# Patient Record
Sex: Male | Born: 1950 | Race: White | Hispanic: No | Marital: Married | Smoking: Never smoker
Health system: Southern US, Community
[De-identification: ages and names within clinical notes are randomized; demographics above are authoritative.]

## PROBLEM LIST (undated history)

## (undated) DIAGNOSIS — Z789 Other specified health status: Secondary | ICD-10-CM

## (undated) DIAGNOSIS — C4490 Unspecified malignant neoplasm of skin, unspecified: Secondary | ICD-10-CM

## (undated) DIAGNOSIS — D649 Anemia, unspecified: Secondary | ICD-10-CM

## (undated) DIAGNOSIS — J189 Pneumonia, unspecified organism: Secondary | ICD-10-CM

## (undated) HISTORY — PX: CYSTOSCOPY WITH INSERTION OF UROLIFT: SHX6678

---

## 1997-10-08 ENCOUNTER — Encounter: Admission: RE | Admit: 1997-10-08 | Discharge: 1997-10-08 | Payer: Self-pay | Admitting: Family Medicine

## 1999-07-10 ENCOUNTER — Encounter: Admission: RE | Admit: 1999-07-10 | Discharge: 1999-07-10 | Payer: Self-pay | Admitting: Sports Medicine

## 2001-12-02 ENCOUNTER — Encounter: Admission: RE | Admit: 2001-12-02 | Discharge: 2001-12-02 | Payer: Self-pay | Admitting: Family Medicine

## 2002-12-11 ENCOUNTER — Encounter: Admission: RE | Admit: 2002-12-11 | Discharge: 2002-12-11 | Payer: Self-pay | Admitting: Sports Medicine

## 2003-01-01 ENCOUNTER — Encounter: Admission: RE | Admit: 2003-01-01 | Discharge: 2003-01-01 | Payer: Self-pay | Admitting: Family Medicine

## 2003-01-19 ENCOUNTER — Encounter: Admission: RE | Admit: 2003-01-19 | Discharge: 2003-01-19 | Payer: Self-pay | Admitting: Sports Medicine

## 2003-08-25 ENCOUNTER — Ambulatory Visit (HOSPITAL_COMMUNITY): Admission: RE | Admit: 2003-08-25 | Discharge: 2003-08-25 | Payer: Self-pay | Admitting: Gastroenterology

## 2003-08-25 ENCOUNTER — Encounter (INDEPENDENT_AMBULATORY_CARE_PROVIDER_SITE_OTHER): Payer: Self-pay | Admitting: Specialist

## 2004-11-17 ENCOUNTER — Ambulatory Visit: Payer: Self-pay | Admitting: Sports Medicine

## 2005-04-24 ENCOUNTER — Ambulatory Visit: Payer: Self-pay | Admitting: Sports Medicine

## 2005-12-21 ENCOUNTER — Ambulatory Visit: Payer: Self-pay | Admitting: Sports Medicine

## 2006-04-26 ENCOUNTER — Ambulatory Visit: Payer: Self-pay | Admitting: Sports Medicine

## 2007-03-26 ENCOUNTER — Ambulatory Visit: Payer: Self-pay | Admitting: Sports Medicine

## 2007-03-26 DIAGNOSIS — M2480 Other specific joint derangements of unspecified joint, not elsewhere classified: Secondary | ICD-10-CM

## 2007-03-26 DIAGNOSIS — M7750 Other enthesopathy of unspecified foot: Secondary | ICD-10-CM | POA: Insufficient documentation

## 2007-04-04 ENCOUNTER — Telehealth (INDEPENDENT_AMBULATORY_CARE_PROVIDER_SITE_OTHER): Payer: Self-pay | Admitting: *Deleted

## 2007-04-08 ENCOUNTER — Ambulatory Visit: Payer: Self-pay | Admitting: Sports Medicine

## 2008-03-23 ENCOUNTER — Ambulatory Visit: Payer: Self-pay | Admitting: Sports Medicine

## 2008-03-23 DIAGNOSIS — R209 Unspecified disturbances of skin sensation: Secondary | ICD-10-CM | POA: Insufficient documentation

## 2008-03-23 DIAGNOSIS — M76829 Posterior tibial tendinitis, unspecified leg: Secondary | ICD-10-CM | POA: Insufficient documentation

## 2008-04-01 ENCOUNTER — Ambulatory Visit: Payer: Self-pay | Admitting: Sports Medicine

## 2008-04-01 DIAGNOSIS — M79609 Pain in unspecified limb: Secondary | ICD-10-CM | POA: Insufficient documentation

## 2008-04-23 HISTORY — PX: OTHER SURGICAL HISTORY: SHX169

## 2009-10-03 ENCOUNTER — Ambulatory Visit: Payer: Self-pay | Admitting: Family Medicine

## 2009-10-03 DIAGNOSIS — M722 Plantar fascial fibromatosis: Secondary | ICD-10-CM

## 2009-10-03 DIAGNOSIS — M25559 Pain in unspecified hip: Secondary | ICD-10-CM

## 2010-05-23 NOTE — Assessment & Plan Note (Signed)
Summary: PF,R FOOT PAIN,MC   Vital Signs:  Patient profile:   60 year old male BP sitting:   111 / 78  Vitals Entered By: Lillia Pauls CMA (October 03, 2009 3:39 PM)  History of Present Illness: 1_ right posterior hip pain--worse after 2nd 20 minute exercise period on stationary bike. No radiation, no numbness or tingling inleg. no specific injury. No bowel or bladder incontinence. Pain  4/10 at worst. Some pain w stairs.  2) Right foot pain (plantar fascuia area). has had once before about a year ago and it resolved with some stretches, Pain worst first thing in am--shrp, localized. Wears orthotics regularly and brings them as well as several pairs of shoes for eval. pain worse with extended walking which he does 3-4 m per day for exercise  Past History:  Past Medical History: Last updated: 03/26/2007 Pt. was seen in 1/08 for some shoulder subluxation.  Review of Systems  The patient denies anorexia, fever, weight loss, and weight gain.         see hpi  Physical Exam  General:  alert, well-developed, and well-nourished.     Hip Exam  Hip Exam:    Right:    Inspection:  Normal       Location:  TTP posterior to greater trochanter (insertion gluteus medius)    Swelling:  no    Erythema:  no    ABductor strength on right slightly less than left. has FROm hip. backward extension against resistance increases pain some  GAIt is normal   Foot/Ankle Exam  Foot Exam:    Right:    Inspection:  Normal       Location:  TTP plantar fascia origin    Stability:  stable    Swelling:  no    Erythema:  no    slight pes planus, loss transverse arch neurovascularly intact skin without erythema or lesion, no significant callous. DP pulse 2=   Impression & Recommendations:  Problem # 1:  HIP PAIN, RIGHT (ICD-719.45) weakgluteus medius --exercise HO given  and explained  Problem # 2:  PLANTAR FASCIITIS, RIGHT (ICD-728.71) plantar fascia exercises and stretch given in HO form  and explained in detail rtc 2-3 weeks. if no improvement at that  time could consider injection. I examined both of his orthotic sets and all 4 pairs of shoes and made some recommendatioons. i think current orthotics in OK shape--in 3 m or so will likely need replacement heel posting.

## 2010-06-05 ENCOUNTER — Ambulatory Visit (INDEPENDENT_AMBULATORY_CARE_PROVIDER_SITE_OTHER): Payer: 59 | Admitting: Sports Medicine

## 2010-06-05 ENCOUNTER — Encounter: Payer: Self-pay | Admitting: Sports Medicine

## 2010-06-05 DIAGNOSIS — M79609 Pain in unspecified limb: Secondary | ICD-10-CM

## 2010-06-13 ENCOUNTER — Ambulatory Visit: Payer: Self-pay | Admitting: Sports Medicine

## 2010-06-14 NOTE — Assessment & Plan Note (Signed)
Summary: BIG TOE LEFT FOOT/MJD   Vital Signs:  Patient profile:   60 year old male Pulse rate:   77 / minute BP sitting:   133 / 77  (right arm)  Vitals Entered By: Rochele Pages RN (June 05, 2010 11:57 AM) CC: lt great toe pain w/ walking, posterior hip pain flaring    CC:  lt great toe pain w/ walking and posterior hip pain flaring .  History of Present Illness: left big toe pain x 1 month: burning sensation/pain comes and goes, intermittent, occasional numbness tingling on left great toe lateral, planter side, changed walking shoes and soon after began to have these symptoms. notices the symptoms more after walking on track.  hx of left foot metataralsia  Preventive Screening-Counseling & Management  Alcohol-Tobacco     Smoking Status: never  Social History: Smoking Status:  never  Physical Exam  General:  BP 133/77 Well-developed,well-nourished,in no acute distress; alert,appropriate and cooperative throughout examination Msk:  foot exam: hallux rigidus bilat no joint swelling, no joint redness.   transverse arch collapse bilateral. sensation grossly intact. 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   Pulses:  2+ pedal pulses Additional Exam:  gait shows mild pronation at great toe but otherwise normal   Impression & Recommendations:  Problem # 1:  FOOT PAIN, LEFT (ICD-729.5) pt description of pain/discomfort seems nerve in nature.  May be having some compression of foot 2/2 orthotic wear and causing resultant symptoms.  Orthotic improved with new transverse arch support. Pt to continue hip strengthening and gluteus medias exercises.  pt to try new orthotic and return as needed.  note new MT padding on left lat heel wedge - soft added w foam   Orders Added: 1)  Est. Patient Level III [60454]

## 2010-09-07 ENCOUNTER — Other Ambulatory Visit: Payer: Self-pay | Admitting: Dermatology

## 2010-09-08 NOTE — Op Note (Signed)
Alexander Ortega, Alexander Ortega                         ACCOUNT NO.:  [id number]   MEDICAL RECORD NO.:  [id number]                   PATIENT TYPE:  AMB   LOCATION:  ENDO                                 FACILITY:  [facility]   PHYSICIAN:  Bernette Redbird, M.D.                DATE OF BIRTH:  1951/01/22   DATE OF PROCEDURE:  08/25/2003  DATE OF DISCHARGE:                                 OPERATIVE REPORT   PROCEDURE:  Colonoscopy with biopsies.   INDICATION:  Screening for colon cancer in an asymptomatic 60 year old male.   FINDINGS:  Two diminutive sessile polyps.   DESCRIPTION OF PROCEDURE:  The nature, purpose, and risks of the procedure  had been discussed with the patient who provided written consent.  Sedation  was fentanyl 75 mcg and Versed 7 mg IV without arrhythmias or desaturation.  Digital exam of the prostate showed it to be somewhat homogeneously enlarged  but with a smooth texture and no discrete or suspicious areas of nodularity.  The Olympus adjustable-tension pediatric video colonoscope was advanced to  the cecum with a little bit of looping overcome by external abdominal  compression.  The cecum was identified by clear visualization of the  appendiceal orifice, and pullback was then performed.  The quality of the  prep was quite good.  We used some irrigation to clean things up a little  bit, but it is felt that all areas were well seen.   In the proximal ascending colon, was a 2 x 4 mm very flat sessile polyp  which was removed by several Just above the cecum, were a couple of small,  sessile, roughly 3 mm polyps, each removed by several cold biopsies and in  the distal transverse colon near splenic flexure region, there was a 2 mm  sessile polyp removed by a single cold biopsy.  No large polyps, cancer,  colitis, vascular malformations, or diverticulosis were noted, and  retroflexion in the rectum as well as reinspection of the rectum was  unremarkable.  The patient tolerated the  procedure well, and there were no  apparent complications.   IMPRESSION:  Two diminutive colon polyps, removed as described above  (211.3).   PLAN:  Await pathology results.                                               Bernette Redbird, M.D.    RB/MEDQ  D:  08/25/2003  T:  08/25/2003  Job:  [id number]   cc:   Caryn Bee L. Little, M.D.  [address]  [place]  [place] [address]  Fax: [phone]

## 2011-04-09 ENCOUNTER — Ambulatory Visit (INDEPENDENT_AMBULATORY_CARE_PROVIDER_SITE_OTHER): Payer: 59 | Admitting: Sports Medicine

## 2011-04-09 DIAGNOSIS — M79673 Pain in unspecified foot: Secondary | ICD-10-CM

## 2011-04-09 DIAGNOSIS — M79609 Pain in unspecified limb: Secondary | ICD-10-CM

## 2011-04-12 DIAGNOSIS — M79673 Pain in unspecified foot: Secondary | ICD-10-CM | POA: Insufficient documentation

## 2011-04-12 NOTE — Assessment & Plan Note (Signed)
We added forefoot support and a 1st ray post to his existing orthotics. Unfortunately, there is no way to make him symptom free in bare feet because he will have symptoms whenever the transverse arch is not supported.  He understands this and will try to wear shoes most of the time.  He will also discontinue use of the spacer between the 1st and 2nd digits as this seems to be contributing to increased pressure points on the forefoot.

## 2011-04-12 NOTE — Progress Notes (Signed)
  Subjective:    Patient ID: Alexander Ortega, male    DOB: 11/24/1950, 60 y.o.   MRN: [id number]  HPI 60 y/o male with a history of metatarsalgia treated with orthotics and metatarsal pads is here for follow up.  His previous symptoms have resolved with the use of orthotics.  He is wondering if he needs new orthotics since he has had these for about 3 years.  Also, he has a new pain around the base of the 3rd and 4th toes which he only has in bare feet.  He also has some pain along the lateral surface of the left great toe at the end of the day.  He wears spacers between the 1st and 2nd digits as well as the 3rd and 4th digits.  No new injury.     Review of Systems     Objective:   Physical Exam  Right foot:  He has morton's toe, hind foot valgus and pronation of the midfoot with collapse of long and transverse arch. 5th toe turned in.   Left foot:  He has morton's toe, neutral to mild hindfoot varus with some midfoot pronation. Relatively preserved long arch. Transverse arch collapsed with 5th toe turned in. 3rd and forth digits rotated and rubbing against each other.   There is not tenderness to palpation or swelling of the great toe.    Bilateral lateral foot breakdown worse on left.       Assessment & Plan:

## 2011-08-30 ENCOUNTER — Other Ambulatory Visit: Payer: Self-pay | Admitting: Dermatology

## 2012-04-24 ENCOUNTER — Encounter: Payer: Self-pay | Admitting: Sports Medicine

## 2012-04-24 ENCOUNTER — Ambulatory Visit (INDEPENDENT_AMBULATORY_CARE_PROVIDER_SITE_OTHER): Payer: 59 | Admitting: Sports Medicine

## 2012-04-24 VITALS — BP 118/75 | HR 86 | Ht 70.5 in | Wt 177.0 lb

## 2012-04-24 DIAGNOSIS — M79609 Pain in unspecified limb: Secondary | ICD-10-CM

## 2012-04-24 DIAGNOSIS — M722 Plantar fascial fibromatosis: Secondary | ICD-10-CM

## 2012-04-24 NOTE — Assessment & Plan Note (Signed)
This is a chronic condition  I think it is more associated with his arch collapse and has posterior tibial tendon dysfunction  Given a series of rehabilitation exercises  He does much better with his custom orthotics and we made him a new pair today  Patient was fitted for a : standard, cushioned, semi-rigid orthotic. The orthotic was heated and afterward the patient stood on the orthotic blank positioned on the orthotic stand. The patient was positioned in subtalar neutral position and 10 degrees of ankle dorsiflexion in a weight bearing stance. After completion of molding, a stable base was applied to the orthotic blank. The blank was ground to a stable position for weight bearing. Size: 12 red EVA Base: blue EVA Posting: MT cookies Additional orthotic padding: none but may add lateral post if needed in future  40 mins time

## 2012-04-24 NOTE — Progress Notes (Signed)
  Subjective:    Patient ID: Alexander Ortega, male    DOB: Dec 25, 1950, 62 y.o.   MRN: [id number]  HPI  Pt presents to clinic for f/u of foot pain. Hx of rt heel pain, this has flared recently. Has had plantar fasciitis in the past. Pain slightly worse in the mornings.  Continues to have lt 3-4 MTP pain, worse without foot support- Dr. Lestine Box dx with synovitis.  Using orthotics that were made here about 4 yrs ago.  Also wearing toe spacers on lt between 3-4 toes and sometimes between 1st and 2nd. Rt sometimes wears spacer between 2-3 toes.  Walks 3-4 miles 4-5 days per week.     Review of Systems     Objective:   Physical Exam  2nd toe curls under 1st toe L>R Valgus shift of 1st toe R>L bilat bunionettes  Loss of trans arch bilat Loss of long arch bilat Good post tib function on lt, limited on rt 4th toe deviates under 3rd toe when seated L > R  Rt heel- slightly tender but not directly over the insertion of the plantar fascia     Assessment & Plan:

## 2012-04-24 NOTE — Assessment & Plan Note (Signed)
There is significant break down of the feet bilaterally  He should use orthotics or some support in virtually all of his shoes when he standing or walking

## 2013-11-19 ENCOUNTER — Other Ambulatory Visit: Payer: Self-pay | Admitting: Gastroenterology

## 2015-12-29 ENCOUNTER — Ambulatory Visit (INDEPENDENT_AMBULATORY_CARE_PROVIDER_SITE_OTHER): Payer: 59 | Admitting: Sports Medicine

## 2015-12-29 ENCOUNTER — Encounter: Payer: Self-pay | Admitting: Sports Medicine

## 2015-12-29 DIAGNOSIS — M79671 Pain in right foot: Secondary | ICD-10-CM | POA: Diagnosis not present

## 2015-12-29 DIAGNOSIS — M25559 Pain in unspecified hip: Secondary | ICD-10-CM | POA: Diagnosis not present

## 2015-12-29 DIAGNOSIS — M79672 Pain in left foot: Secondary | ICD-10-CM | POA: Diagnosis not present

## 2015-12-29 NOTE — Assessment & Plan Note (Signed)
His orthotics are 65, 91 and 63 years old but are actually all in pretty good shape  We added additional padding to the left orthotic to support his fifth ray  He should continue using these for walking or running activity

## 2015-12-29 NOTE — Progress Notes (Signed)
CC: Back pain and advice about running  The patient had degenerative disc compression in 2003 after traveling He had seen an orthopedist and switched from running to walking He did some exercises for his back Since that time he has not really had back pain in a few years He walks regularly 10-15 miles per week He would like to start adding a little bit of running again to his walking  His other chronic issues is bilateral foot pain He has some structural breakdown of the feet and so we have kept him in custom orthotics for 15 years In his newest pair he is getting some lateral foot pain she asked me to look at this  Past history  metatarsalgia Right hip pain over the lateral hip  Review of systems No sciatica No cough or sneeze pain in his low back No numbness or weakness in his legs  Physical examination Pleasant male in no acute distress BP 105/71   Ht 5\' 11"  (1.803 m)   Wt 175 lb (79.4 kg)   BMI 24.41 kg/m   Back flexibility is good on flexion and extension without pain Lateral rotation is good Side bending does not cause back pain SI joints have normal mobility Straight leg raise to 80 with no pain bilaterally Strength testing is good of her hamstrings and quads Weakness over his hip abductors bilaterally Feet show structural breakdown of the transverse arch Lateral column breakdown particularly on the left

## 2015-12-29 NOTE — Patient Instructions (Signed)
OK to gradually start some running Do walk run with no more than 10 mins. Total running at first  Hip weakness in gluteus medius Do 3 exercises  Good to add biking once or twice a week to work other muslces  For back do Knee to chest stretch Knee to the opposite shoulder 3 stretches for 20 secs each

## 2015-12-29 NOTE — Assessment & Plan Note (Signed)
I evaluated his low back and gave him a couple of stretches to do for this  His bigger problem is some weakness in his hip abduction He was given a home exercise program for this  Continue back stretches  Okay to start adding some running to his exercise program

## 2018-01-29 DIAGNOSIS — N401 Enlarged prostate with lower urinary tract symptoms: Secondary | ICD-10-CM | POA: Diagnosis not present

## 2018-01-29 DIAGNOSIS — R351 Nocturia: Secondary | ICD-10-CM | POA: Diagnosis not present

## 2018-01-29 DIAGNOSIS — R972 Elevated prostate specific antigen [PSA]: Secondary | ICD-10-CM | POA: Diagnosis not present

## 2018-02-03 DIAGNOSIS — R351 Nocturia: Secondary | ICD-10-CM | POA: Diagnosis not present

## 2018-02-20 DIAGNOSIS — C44722 Squamous cell carcinoma of skin of right lower limb, including hip: Secondary | ICD-10-CM | POA: Diagnosis not present

## 2018-02-20 DIAGNOSIS — Z85828 Personal history of other malignant neoplasm of skin: Secondary | ICD-10-CM | POA: Diagnosis not present

## 2018-02-20 DIAGNOSIS — D485 Neoplasm of uncertain behavior of skin: Secondary | ICD-10-CM | POA: Diagnosis not present

## 2018-02-20 DIAGNOSIS — Z85820 Personal history of malignant melanoma of skin: Secondary | ICD-10-CM | POA: Diagnosis not present

## 2018-02-20 DIAGNOSIS — L821 Other seborrheic keratosis: Secondary | ICD-10-CM | POA: Diagnosis not present

## 2018-03-06 DIAGNOSIS — C44722 Squamous cell carcinoma of skin of right lower limb, including hip: Secondary | ICD-10-CM | POA: Diagnosis not present

## 2018-03-06 DIAGNOSIS — Z85820 Personal history of malignant melanoma of skin: Secondary | ICD-10-CM | POA: Diagnosis not present

## 2018-03-06 DIAGNOSIS — Z85828 Personal history of other malignant neoplasm of skin: Secondary | ICD-10-CM | POA: Diagnosis not present

## 2018-03-24 DIAGNOSIS — M47892 Other spondylosis, cervical region: Secondary | ICD-10-CM | POA: Diagnosis not present

## 2018-03-24 DIAGNOSIS — M542 Cervicalgia: Secondary | ICD-10-CM | POA: Diagnosis not present

## 2018-04-02 DIAGNOSIS — Z86010 Personal history of colonic polyps: Secondary | ICD-10-CM | POA: Diagnosis not present

## 2018-04-02 DIAGNOSIS — Z23 Encounter for immunization: Secondary | ICD-10-CM | POA: Diagnosis not present

## 2018-04-02 DIAGNOSIS — E7800 Pure hypercholesterolemia, unspecified: Secondary | ICD-10-CM | POA: Diagnosis not present

## 2018-04-02 DIAGNOSIS — Z79899 Other long term (current) drug therapy: Secondary | ICD-10-CM | POA: Diagnosis not present

## 2018-04-02 DIAGNOSIS — N401 Enlarged prostate with lower urinary tract symptoms: Secondary | ICD-10-CM | POA: Diagnosis not present

## 2018-04-02 DIAGNOSIS — Z85820 Personal history of malignant melanoma of skin: Secondary | ICD-10-CM | POA: Diagnosis not present

## 2018-04-02 DIAGNOSIS — M791 Myalgia, unspecified site: Secondary | ICD-10-CM | POA: Diagnosis not present

## 2018-04-02 DIAGNOSIS — Z0000 Encounter for general adult medical examination without abnormal findings: Secondary | ICD-10-CM | POA: Diagnosis not present

## 2018-04-02 DIAGNOSIS — M47816 Spondylosis without myelopathy or radiculopathy, lumbar region: Secondary | ICD-10-CM | POA: Diagnosis not present

## 2018-05-15 DIAGNOSIS — Z85820 Personal history of malignant melanoma of skin: Secondary | ICD-10-CM | POA: Diagnosis not present

## 2018-05-15 DIAGNOSIS — Z85828 Personal history of other malignant neoplasm of skin: Secondary | ICD-10-CM | POA: Diagnosis not present

## 2018-05-15 DIAGNOSIS — L720 Epidermal cyst: Secondary | ICD-10-CM | POA: Diagnosis not present

## 2018-07-02 DIAGNOSIS — N401 Enlarged prostate with lower urinary tract symptoms: Secondary | ICD-10-CM | POA: Diagnosis not present

## 2018-07-02 DIAGNOSIS — R972 Elevated prostate specific antigen [PSA]: Secondary | ICD-10-CM | POA: Diagnosis not present

## 2018-07-02 DIAGNOSIS — R351 Nocturia: Secondary | ICD-10-CM | POA: Diagnosis not present

## 2018-07-03 DIAGNOSIS — D2272 Melanocytic nevi of left lower limb, including hip: Secondary | ICD-10-CM | POA: Diagnosis not present

## 2018-07-03 DIAGNOSIS — D2271 Melanocytic nevi of right lower limb, including hip: Secondary | ICD-10-CM | POA: Diagnosis not present

## 2018-07-03 DIAGNOSIS — Z85820 Personal history of malignant melanoma of skin: Secondary | ICD-10-CM | POA: Diagnosis not present

## 2018-07-03 DIAGNOSIS — Z85828 Personal history of other malignant neoplasm of skin: Secondary | ICD-10-CM | POA: Diagnosis not present

## 2018-07-03 DIAGNOSIS — D1801 Hemangioma of skin and subcutaneous tissue: Secondary | ICD-10-CM | POA: Diagnosis not present

## 2018-07-03 DIAGNOSIS — L821 Other seborrheic keratosis: Secondary | ICD-10-CM | POA: Diagnosis not present

## 2018-07-03 DIAGNOSIS — L570 Actinic keratosis: Secondary | ICD-10-CM | POA: Diagnosis not present

## 2018-07-03 DIAGNOSIS — L723 Sebaceous cyst: Secondary | ICD-10-CM | POA: Diagnosis not present

## 2018-07-03 DIAGNOSIS — D2262 Melanocytic nevi of left upper limb, including shoulder: Secondary | ICD-10-CM | POA: Diagnosis not present

## 2018-07-03 DIAGNOSIS — L738 Other specified follicular disorders: Secondary | ICD-10-CM | POA: Diagnosis not present

## 2018-07-03 DIAGNOSIS — L814 Other melanin hyperpigmentation: Secondary | ICD-10-CM | POA: Diagnosis not present

## 2018-07-03 DIAGNOSIS — D225 Melanocytic nevi of trunk: Secondary | ICD-10-CM | POA: Diagnosis not present

## 2018-09-01 DIAGNOSIS — C44729 Squamous cell carcinoma of skin of left lower limb, including hip: Secondary | ICD-10-CM | POA: Diagnosis not present

## 2018-09-01 DIAGNOSIS — L239 Allergic contact dermatitis, unspecified cause: Secondary | ICD-10-CM | POA: Diagnosis not present

## 2018-09-01 DIAGNOSIS — Z85828 Personal history of other malignant neoplasm of skin: Secondary | ICD-10-CM | POA: Diagnosis not present

## 2018-09-01 DIAGNOSIS — D485 Neoplasm of uncertain behavior of skin: Secondary | ICD-10-CM | POA: Diagnosis not present

## 2018-09-01 DIAGNOSIS — Z85820 Personal history of malignant melanoma of skin: Secondary | ICD-10-CM | POA: Diagnosis not present

## 2018-09-04 DIAGNOSIS — L0889 Other specified local infections of the skin and subcutaneous tissue: Secondary | ICD-10-CM | POA: Diagnosis not present

## 2018-11-26 DIAGNOSIS — Z85820 Personal history of malignant melanoma of skin: Secondary | ICD-10-CM | POA: Diagnosis not present

## 2018-11-26 DIAGNOSIS — D485 Neoplasm of uncertain behavior of skin: Secondary | ICD-10-CM | POA: Diagnosis not present

## 2018-11-26 DIAGNOSIS — C44729 Squamous cell carcinoma of skin of left lower limb, including hip: Secondary | ICD-10-CM | POA: Diagnosis not present

## 2018-11-26 DIAGNOSIS — Z85828 Personal history of other malignant neoplasm of skin: Secondary | ICD-10-CM | POA: Diagnosis not present

## 2019-01-14 DIAGNOSIS — R972 Elevated prostate specific antigen [PSA]: Secondary | ICD-10-CM | POA: Diagnosis not present

## 2019-01-15 DIAGNOSIS — I8392 Asymptomatic varicose veins of left lower extremity: Secondary | ICD-10-CM | POA: Diagnosis not present

## 2019-01-15 DIAGNOSIS — I8391 Asymptomatic varicose veins of right lower extremity: Secondary | ICD-10-CM | POA: Diagnosis not present

## 2019-01-15 DIAGNOSIS — L821 Other seborrheic keratosis: Secondary | ICD-10-CM | POA: Diagnosis not present

## 2019-01-15 DIAGNOSIS — I8312 Varicose veins of left lower extremity with inflammation: Secondary | ICD-10-CM | POA: Diagnosis not present

## 2019-01-15 DIAGNOSIS — L819 Disorder of pigmentation, unspecified: Secondary | ICD-10-CM | POA: Diagnosis not present

## 2019-01-15 DIAGNOSIS — Z85828 Personal history of other malignant neoplasm of skin: Secondary | ICD-10-CM | POA: Diagnosis not present

## 2019-01-15 DIAGNOSIS — D2262 Melanocytic nevi of left upper limb, including shoulder: Secondary | ICD-10-CM | POA: Diagnosis not present

## 2019-01-15 DIAGNOSIS — Z85820 Personal history of malignant melanoma of skin: Secondary | ICD-10-CM | POA: Diagnosis not present

## 2019-01-15 DIAGNOSIS — I8311 Varicose veins of right lower extremity with inflammation: Secondary | ICD-10-CM | POA: Diagnosis not present

## 2019-01-15 DIAGNOSIS — L720 Epidermal cyst: Secondary | ICD-10-CM | POA: Diagnosis not present

## 2019-01-15 DIAGNOSIS — L814 Other melanin hyperpigmentation: Secondary | ICD-10-CM | POA: Diagnosis not present

## 2019-01-15 DIAGNOSIS — I872 Venous insufficiency (chronic) (peripheral): Secondary | ICD-10-CM | POA: Diagnosis not present

## 2019-02-27 DIAGNOSIS — N5201 Erectile dysfunction due to arterial insufficiency: Secondary | ICD-10-CM | POA: Diagnosis not present

## 2019-02-27 DIAGNOSIS — R351 Nocturia: Secondary | ICD-10-CM | POA: Diagnosis not present

## 2019-02-27 DIAGNOSIS — N401 Enlarged prostate with lower urinary tract symptoms: Secondary | ICD-10-CM | POA: Diagnosis not present

## 2019-02-27 DIAGNOSIS — R972 Elevated prostate specific antigen [PSA]: Secondary | ICD-10-CM | POA: Diagnosis not present

## 2019-03-12 DIAGNOSIS — L820 Inflamed seborrheic keratosis: Secondary | ICD-10-CM | POA: Diagnosis not present

## 2019-03-12 DIAGNOSIS — Z85820 Personal history of malignant melanoma of skin: Secondary | ICD-10-CM | POA: Diagnosis not present

## 2019-03-12 DIAGNOSIS — Z85828 Personal history of other malignant neoplasm of skin: Secondary | ICD-10-CM | POA: Diagnosis not present

## 2019-03-12 DIAGNOSIS — D2262 Melanocytic nevi of left upper limb, including shoulder: Secondary | ICD-10-CM | POA: Diagnosis not present

## 2019-03-17 ENCOUNTER — Other Ambulatory Visit: Payer: Self-pay | Admitting: Urology

## 2019-03-17 DIAGNOSIS — R972 Elevated prostate specific antigen [PSA]: Secondary | ICD-10-CM

## 2019-04-08 DIAGNOSIS — Z1159 Encounter for screening for other viral diseases: Secondary | ICD-10-CM | POA: Diagnosis not present

## 2019-04-08 DIAGNOSIS — Z79899 Other long term (current) drug therapy: Secondary | ICD-10-CM | POA: Diagnosis not present

## 2019-04-08 DIAGNOSIS — Z0000 Encounter for general adult medical examination without abnormal findings: Secondary | ICD-10-CM | POA: Diagnosis not present

## 2019-04-08 DIAGNOSIS — N401 Enlarged prostate with lower urinary tract symptoms: Secondary | ICD-10-CM | POA: Diagnosis not present

## 2019-04-08 DIAGNOSIS — Z85820 Personal history of malignant melanoma of skin: Secondary | ICD-10-CM | POA: Diagnosis not present

## 2019-04-08 DIAGNOSIS — H6123 Impacted cerumen, bilateral: Secondary | ICD-10-CM | POA: Diagnosis not present

## 2019-04-08 DIAGNOSIS — R972 Elevated prostate specific antigen [PSA]: Secondary | ICD-10-CM | POA: Diagnosis not present

## 2019-04-08 DIAGNOSIS — E7800 Pure hypercholesterolemia, unspecified: Secondary | ICD-10-CM | POA: Diagnosis not present

## 2019-04-08 DIAGNOSIS — Z23 Encounter for immunization: Secondary | ICD-10-CM | POA: Diagnosis not present

## 2019-04-09 ENCOUNTER — Ambulatory Visit (HOSPITAL_COMMUNITY): Admission: RE | Admit: 2019-04-09 | Payer: PPO | Source: Ambulatory Visit

## 2019-04-28 ENCOUNTER — Ambulatory Visit (HOSPITAL_COMMUNITY): Payer: PPO

## 2019-05-05 ENCOUNTER — Other Ambulatory Visit: Payer: Self-pay

## 2019-05-05 ENCOUNTER — Ambulatory Visit (HOSPITAL_COMMUNITY)
Admission: RE | Admit: 2019-05-05 | Discharge: 2019-05-05 | Disposition: A | Discharge status: Home / Self Care | Payer: PPO | Source: Ambulatory Visit | Attending: Urology | Admitting: Urology

## 2019-05-05 DIAGNOSIS — R972 Elevated prostate specific antigen [PSA]: Secondary | ICD-10-CM | POA: Insufficient documentation

## 2019-05-05 LAB — POCT I-STAT CREATININE: Creatinine, Ser: 1 mg/dL (ref 0.61–1.24)

## 2019-05-05 MED ORDER — GADOBUTROL 1 MMOL/ML IV SOLN
8.0000 mL | Freq: Once | INTRAVENOUS | Status: AC | PRN
  Administered 2019-05-05: 8 mL via INTRAVENOUS

## 2019-05-05 MED ORDER — LIDOCAINE HCL URETHRAL/MUCOSAL 2 % EX GEL
CUTANEOUS | Status: AC
  Filled 2019-05-05: qty 30

## 2019-05-20 DIAGNOSIS — Z87898 Personal history of other specified conditions: Secondary | ICD-10-CM | POA: Diagnosis not present

## 2019-05-20 DIAGNOSIS — M25511 Pain in right shoulder: Secondary | ICD-10-CM | POA: Diagnosis not present

## 2019-06-29 DIAGNOSIS — M25511 Pain in right shoulder: Secondary | ICD-10-CM | POA: Diagnosis not present

## 2019-07-15 DIAGNOSIS — D2271 Melanocytic nevi of right lower limb, including hip: Secondary | ICD-10-CM | POA: Diagnosis not present

## 2019-07-15 DIAGNOSIS — L738 Other specified follicular disorders: Secondary | ICD-10-CM | POA: Diagnosis not present

## 2019-07-15 DIAGNOSIS — Z85828 Personal history of other malignant neoplasm of skin: Secondary | ICD-10-CM | POA: Diagnosis not present

## 2019-07-15 DIAGNOSIS — D1801 Hemangioma of skin and subcutaneous tissue: Secondary | ICD-10-CM | POA: Diagnosis not present

## 2019-07-15 DIAGNOSIS — I788 Other diseases of capillaries: Secondary | ICD-10-CM | POA: Diagnosis not present

## 2019-07-15 DIAGNOSIS — L812 Freckles: Secondary | ICD-10-CM | POA: Diagnosis not present

## 2019-07-15 DIAGNOSIS — D225 Melanocytic nevi of trunk: Secondary | ICD-10-CM | POA: Diagnosis not present

## 2019-07-15 DIAGNOSIS — D2272 Melanocytic nevi of left lower limb, including hip: Secondary | ICD-10-CM | POA: Diagnosis not present

## 2019-07-15 DIAGNOSIS — L820 Inflamed seborrheic keratosis: Secondary | ICD-10-CM | POA: Diagnosis not present

## 2019-07-15 DIAGNOSIS — L814 Other melanin hyperpigmentation: Secondary | ICD-10-CM | POA: Diagnosis not present

## 2019-07-15 DIAGNOSIS — Z85820 Personal history of malignant melanoma of skin: Secondary | ICD-10-CM | POA: Diagnosis not present

## 2019-07-15 DIAGNOSIS — L821 Other seborrheic keratosis: Secondary | ICD-10-CM | POA: Diagnosis not present

## 2019-08-10 DIAGNOSIS — M25511 Pain in right shoulder: Secondary | ICD-10-CM | POA: Diagnosis not present

## 2019-08-19 DIAGNOSIS — N401 Enlarged prostate with lower urinary tract symptoms: Secondary | ICD-10-CM | POA: Diagnosis not present

## 2019-08-24 DIAGNOSIS — M25511 Pain in right shoulder: Secondary | ICD-10-CM | POA: Diagnosis not present

## 2019-08-24 DIAGNOSIS — Z87898 Personal history of other specified conditions: Secondary | ICD-10-CM | POA: Diagnosis not present

## 2019-08-24 DIAGNOSIS — E7800 Pure hypercholesterolemia, unspecified: Secondary | ICD-10-CM | POA: Diagnosis not present

## 2019-08-26 DIAGNOSIS — N401 Enlarged prostate with lower urinary tract symptoms: Secondary | ICD-10-CM | POA: Diagnosis not present

## 2019-08-26 DIAGNOSIS — R8281 Pyuria: Secondary | ICD-10-CM | POA: Diagnosis not present

## 2019-08-26 DIAGNOSIS — R8271 Bacteriuria: Secondary | ICD-10-CM | POA: Diagnosis not present

## 2019-08-26 DIAGNOSIS — R351 Nocturia: Secondary | ICD-10-CM | POA: Diagnosis not present

## 2019-08-26 DIAGNOSIS — R972 Elevated prostate specific antigen [PSA]: Secondary | ICD-10-CM | POA: Diagnosis not present

## 2019-09-23 DIAGNOSIS — M25562 Pain in left knee: Secondary | ICD-10-CM | POA: Diagnosis not present

## 2019-10-05 DIAGNOSIS — M25562 Pain in left knee: Secondary | ICD-10-CM | POA: Diagnosis not present

## 2019-10-10 DIAGNOSIS — M25562 Pain in left knee: Secondary | ICD-10-CM | POA: Diagnosis not present

## 2019-10-12 DIAGNOSIS — M25562 Pain in left knee: Secondary | ICD-10-CM | POA: Diagnosis not present

## 2019-11-19 DIAGNOSIS — R509 Fever, unspecified: Secondary | ICD-10-CM | POA: Diagnosis not present

## 2019-11-19 DIAGNOSIS — U071 COVID-19: Secondary | ICD-10-CM | POA: Diagnosis not present

## 2019-11-20 DIAGNOSIS — U071 COVID-19: Secondary | ICD-10-CM | POA: Diagnosis not present

## 2019-11-20 DIAGNOSIS — R509 Fever, unspecified: Secondary | ICD-10-CM | POA: Diagnosis not present

## 2019-11-29 ENCOUNTER — Other Ambulatory Visit: Payer: Self-pay

## 2019-11-29 ENCOUNTER — Emergency Department (HOSPITAL_COMMUNITY): Payer: PPO

## 2019-11-29 ENCOUNTER — Inpatient Hospital Stay (HOSPITAL_COMMUNITY)
Admission: EM | Admit: 2019-11-29 | Discharge: 2019-12-01 | DRG: 177 | Disposition: A | Discharge status: Home / Self Care | Payer: PPO | Attending: Internal Medicine | Admitting: Internal Medicine

## 2019-11-29 DIAGNOSIS — U071 COVID-19: Secondary | ICD-10-CM | POA: Diagnosis not present

## 2019-11-29 DIAGNOSIS — R110 Nausea: Secondary | ICD-10-CM | POA: Diagnosis not present

## 2019-11-29 DIAGNOSIS — Z8249 Family history of ischemic heart disease and other diseases of the circulatory system: Secondary | ICD-10-CM

## 2019-11-29 DIAGNOSIS — J1282 Pneumonia due to coronavirus disease 2019: Secondary | ICD-10-CM | POA: Diagnosis not present

## 2019-11-29 DIAGNOSIS — E861 Hypovolemia: Secondary | ICD-10-CM | POA: Diagnosis not present

## 2019-11-29 DIAGNOSIS — Z7982 Long term (current) use of aspirin: Secondary | ICD-10-CM

## 2019-11-29 DIAGNOSIS — G4700 Insomnia, unspecified: Secondary | ICD-10-CM | POA: Diagnosis not present

## 2019-11-29 DIAGNOSIS — E871 Hypo-osmolality and hyponatremia: Secondary | ICD-10-CM | POA: Diagnosis not present

## 2019-11-29 DIAGNOSIS — R0902 Hypoxemia: Secondary | ICD-10-CM

## 2019-11-29 DIAGNOSIS — R918 Other nonspecific abnormal finding of lung field: Secondary | ICD-10-CM | POA: Diagnosis not present

## 2019-11-29 DIAGNOSIS — D649 Anemia, unspecified: Secondary | ICD-10-CM | POA: Diagnosis not present

## 2019-11-29 DIAGNOSIS — R21 Rash and other nonspecific skin eruption: Secondary | ICD-10-CM | POA: Diagnosis not present

## 2019-11-29 DIAGNOSIS — J9601 Acute respiratory failure with hypoxia: Secondary | ICD-10-CM | POA: Diagnosis present

## 2019-11-29 DIAGNOSIS — R0602 Shortness of breath: Secondary | ICD-10-CM | POA: Diagnosis present

## 2019-11-29 DIAGNOSIS — Z79899 Other long term (current) drug therapy: Secondary | ICD-10-CM

## 2019-11-29 DIAGNOSIS — G9340 Encephalopathy, unspecified: Secondary | ICD-10-CM

## 2019-11-29 HISTORY — DX: Other specified health status: Z78.9

## 2019-11-29 LAB — COMPREHENSIVE METABOLIC PANEL
ALT: 32 U/L (ref 0–44)
AST: 66 U/L — ABNORMAL HIGH (ref 15–41)
Albumin: 2.9 g/dL — ABNORMAL LOW (ref 3.5–5.0)
Alkaline Phosphatase: 33 U/L — ABNORMAL LOW (ref 38–126)
Anion gap: 10 (ref 5–15)
BUN: 12 mg/dL (ref 8–23)
CO2: 22 mmol/L (ref 22–32)
Calcium: 7.4 mg/dL — ABNORMAL LOW (ref 8.9–10.3)
Chloride: 92 mmol/L — ABNORMAL LOW (ref 98–111)
Creatinine, Ser: 0.91 mg/dL (ref 0.61–1.24)
GFR calc Af Amer: >60 mL/min (ref >60)
GFR calc non Af Amer: >60 mL/min (ref >60)
Glucose, Bld: 107 mg/dL — ABNORMAL HIGH (ref 70–99)
Potassium: 4 mmol/L (ref 3.5–5.1)
Sodium: 124 mmol/L — ABNORMAL LOW (ref 135–145)
Total Bilirubin: 0.5 mg/dL (ref 0.3–1.2)
Total Protein: 6.4 g/dL — ABNORMAL LOW (ref 6.5–8.1)

## 2019-11-29 LAB — CBC WITH DIFFERENTIAL/PLATELET
Abs Immature Granulocytes: 0.05 10*3/uL (ref 0.00–0.07)
Basophils Absolute: 0 10*3/uL (ref 0.0–0.1)
Basophils Relative: 0 %
Eosinophils Absolute: 0 10*3/uL (ref 0.0–0.5)
Eosinophils Relative: 1 %
HCT: 33 % — ABNORMAL LOW (ref 39.0–52.0)
Hemoglobin: 11 g/dL — ABNORMAL LOW (ref 13.0–17.0)
Immature Granulocytes: 1 %
Lymphocytes Relative: 10 %
Lymphs Abs: 0.6 10*3/uL — ABNORMAL LOW (ref 0.7–4.0)
MCH: 30.8 pg (ref 26.0–34.0)
MCHC: 33.3 g/dL (ref 30.0–36.0)
MCV: 92.4 fL (ref 80.0–100.0)
Monocytes Absolute: 0.1 10*3/uL (ref 0.1–1.0)
Monocytes Relative: 2 %
Neutro Abs: 4.6 10*3/uL (ref 1.7–7.7)
Neutrophils Relative %: 86 %
Platelets: 194 10*3/uL (ref 150–400)
RBC: 3.57 MIL/uL — ABNORMAL LOW (ref 4.22–5.81)
RDW: 13.1 % (ref 11.5–15.5)
WBC: 5.4 10*3/uL (ref 4.0–10.5)
nRBC: 0 % (ref 0.0–0.2)

## 2019-11-29 LAB — ABO/RH: ABO/RH(D): B POS

## 2019-11-29 LAB — C-REACTIVE PROTEIN: CRP: 10.8 mg/dL — ABNORMAL HIGH (ref <1.0)

## 2019-11-29 LAB — OSMOLALITY, URINE: Osmolality, Ur: 153 mOsm/kg — ABNORMAL LOW (ref 300–900)

## 2019-11-29 LAB — SARS CORONAVIRUS 2 BY RT PCR (HOSPITAL ORDER, PERFORMED IN ~~LOC~~ HOSPITAL LAB): SARS Coronavirus 2: POSITIVE — AB

## 2019-11-29 LAB — FERRITIN: Ferritin: 341 ng/mL — ABNORMAL HIGH (ref 24–336)

## 2019-11-29 LAB — LACTATE DEHYDROGENASE: LDH: 403 U/L — ABNORMAL HIGH (ref 98–192)

## 2019-11-29 LAB — D-DIMER, QUANTITATIVE: D-Dimer, Quant: 2.67 ug/mL-FEU — ABNORMAL HIGH (ref 0.00–0.50)

## 2019-11-29 LAB — FIBRINOGEN: Fibrinogen: 760 mg/dL — ABNORMAL HIGH (ref 210–475)

## 2019-11-29 LAB — SODIUM, URINE, RANDOM: Sodium, Ur: 17 mmol/L

## 2019-11-29 LAB — PROCALCITONIN: Procalcitonin: <0.1 ng/mL

## 2019-11-29 LAB — HIV ANTIBODY (ROUTINE TESTING W REFLEX): HIV Screen 4th Generation wRfx: NONREACTIVE

## 2019-11-29 MED ORDER — SODIUM CHLORIDE 0.9 % IV SOLN
100.0000 mg | Freq: Every day | INTRAVENOUS | Status: DC
  Administered 2019-11-30 – 2019-12-01 (×2): 100 mg via INTRAVENOUS
  Filled 2019-11-29 (×2): qty 20

## 2019-11-29 MED ORDER — ONDANSETRON HCL 4 MG/2ML IJ SOLN: 4.0000 mg | Freq: Four times a day (QID) | INTRAMUSCULAR | Status: DC | PRN

## 2019-11-29 MED ORDER — SODIUM CHLORIDE 0.9 % IV SOLN
100.0000 mg | Freq: Once | INTRAVENOUS | Status: AC
  Administered 2019-11-29: 100 mg via INTRAVENOUS
  Filled 2019-11-29: qty 20

## 2019-11-29 MED ORDER — ONDANSETRON HCL 4 MG PO TABS: 4.0000 mg | ORAL_TABLET | Freq: Four times a day (QID) | ORAL | Status: DC | PRN

## 2019-11-29 MED ORDER — GUAIFENESIN-DM 100-10 MG/5ML PO SYRP: 10.0000 mL | ORAL_SOLUTION | ORAL | Status: DC | PRN

## 2019-11-29 MED ORDER — SODIUM CHLORIDE 0.9 % IV SOLN: 200.0000 mg | Freq: Once | INTRAVENOUS | Status: DC

## 2019-11-29 MED ORDER — AMITRIPTYLINE HCL 25 MG PO TABS
25.0000 mg | ORAL_TABLET | Freq: Every day | ORAL | Status: DC
  Administered 2019-11-29 – 2019-11-30 (×2): 25 mg via ORAL
  Filled 2019-11-29 (×2): qty 1

## 2019-11-29 MED ORDER — SODIUM CHLORIDE 0.9 % IV SOLN: INTRAVENOUS | Status: DC

## 2019-11-29 MED ORDER — ZINC SULFATE 220 (50 ZN) MG PO CAPS
220.0000 mg | ORAL_CAPSULE | Freq: Every day | ORAL | Status: DC
  Administered 2019-11-29 – 2019-12-01 (×3): 220 mg via ORAL
  Filled 2019-11-29 (×3): qty 1

## 2019-11-29 MED ORDER — ACETAMINOPHEN 325 MG PO TABS
650.0000 mg | ORAL_TABLET | Freq: Four times a day (QID) | ORAL | Status: DC | PRN
  Administered 2019-11-29: 650 mg via ORAL
  Filled 2019-11-29: qty 2

## 2019-11-29 MED ORDER — ACETAMINOPHEN 325 MG PO TABS
650.0000 mg | ORAL_TABLET | Freq: Once | ORAL | Status: AC
  Administered 2019-11-29: 650 mg via ORAL
  Filled 2019-11-29: qty 2

## 2019-11-29 MED ORDER — TRIAMCINOLONE ACETONIDE 0.5 % EX CREA
TOPICAL_CREAM | Freq: Three times a day (TID) | CUTANEOUS | Status: DC
  Filled 2019-11-29: qty 15

## 2019-11-29 MED ORDER — HYDROCOD POLST-CPM POLST ER 10-8 MG/5ML PO SUER: 5.0000 mL | Freq: Two times a day (BID) | ORAL | Status: DC | PRN

## 2019-11-29 MED ORDER — ASCORBIC ACID 500 MG PO TABS
500.0000 mg | ORAL_TABLET | Freq: Every day | ORAL | Status: DC
  Administered 2019-11-29 – 2019-12-01 (×3): 500 mg via ORAL
  Filled 2019-11-29 (×3): qty 1

## 2019-11-29 MED ORDER — TRAZODONE HCL 100 MG PO TABS
50.0000 mg | ORAL_TABLET | Freq: Once | ORAL | Status: AC
  Administered 2019-11-30: 50 mg via ORAL
  Filled 2019-11-29: qty 1

## 2019-11-29 MED ORDER — FAMOTIDINE 20 MG PO TABS
20.0000 mg | ORAL_TABLET | Freq: Every day | ORAL | Status: DC
  Administered 2019-11-29 – 2019-12-01 (×3): 20 mg via ORAL
  Filled 2019-11-29 (×3): qty 1

## 2019-11-29 MED ORDER — ENOXAPARIN SODIUM 40 MG/0.4ML ~~LOC~~ SOLN
40.0000 mg | SUBCUTANEOUS | Status: DC
  Administered 2019-11-29 – 2019-12-01 (×3): 40 mg via SUBCUTANEOUS
  Filled 2019-11-29 (×3): qty 0.4

## 2019-11-29 MED ORDER — SODIUM CHLORIDE 0.9 % IV SOLN: 100.0000 mg | Freq: Every day | INTRAVENOUS | Status: DC

## 2019-11-29 MED ORDER — METHYLPREDNISOLONE SODIUM SUCC 125 MG IJ SOLR
60.0000 mg | Freq: Two times a day (BID) | INTRAMUSCULAR | Status: DC
  Administered 2019-11-29 – 2019-12-01 (×5): 60 mg via INTRAVENOUS
  Filled 2019-11-29 (×5): qty 2

## 2019-11-29 NOTE — ED Triage Notes (Signed)
Pt c/o COVID 19 sx starting 7/27.  Dx 7/30.  Pt c/o persistent fever, insomnia. Also c/o rash on back x1 week.  Poor PO intake. Denies dizziness, denies LOC.   Pt reports taking tylenol for fever, last taken "early this morning, 0330 or so."

## 2019-11-29 NOTE — ED Provider Notes (Signed)
[facility] DEPT Provider Note   CSN: [id number] Arrival date & time: 11/29/19  6010     History Chief Complaint  Patient presents with  . Insomnia  . Fever    Alexander Ortega is a 68 y.o. male.  HPI He presents for evaluation of difficulty sleeping, and Covid infection.  He feels like he is having a fever that will not go away despite taking Tylenol, and that that is keeping him awake at night.  He was diagnosed with a Covid infection on 12/21/2019.  He does not currently have documentation with him about the type of testing.  He complains of ongoing cough and rash associated with this illness.  He began to be sick on 11/18/2019.  His wife is also being seen today in the emergency department with Covid infection which is symptomatic.  The patient did not have a Covid vaccine.  His cough is nonproductive.  He denies shortness of breath.  He is using melatonin to help with sleep but states he is unable to sleep.  He does not have any other current complaints.  There are no other known modifying factors.    No past medical history on file.  Patient Active Problem List   Diagnosis Date Noted  . Pneumonia due to COVID-19 virus 11/29/2019  . Foot pain 04/12/2011  . HIP PAIN, RIGHT 10/03/2009  . PLANTAR FASCIITIS, RIGHT 10/03/2009  . FOOT PAIN, BILATERAL 04/01/2008  . TIBIALIS TENDINITIS 03/23/2008  . NUMBNESS 03/23/2008  . SUBLUXATION, JOINT 03/26/2007  . METATARSALGIA 03/26/2007         No family history on file.  Social History   Tobacco Use  . Smoking status: Never Smoker  . Smokeless tobacco: Never Used  Substance Use Topics  . Alcohol use: Not on file  . Drug use: Not on file    Home Medications Prior to Admission medications   Medication Sig Start Date End Date Taking? Authorizing Provider  amitriptyline (ELAVIL) 25 MG tablet Take 25 mg by mouth at bedtime.      [provider]  aspirin EC 81 MG tablet Take 81 mg by mouth  daily.    [provider]  B Complex-C-Folic Acid (MULTIVITAMIN, STRESS FORMULA) tablet Take 1 tablet by mouth daily.      [provider]  KOREAN GINSENG 100 MG CAPS Take by mouth 3 (three) times daily.      [provider]  Melatonin 1 MG TABS Take 1 mg by mouth at bedtime as needed (sleep).     [provider]  Misc Natural Products (LUTEIN 20 PO) Take by mouth 2 (two) times daily.    [provider]  Omega-3 Fatty Acids (FISH OIL) 1000 MG CPDR Take 1,000 mg by mouth daily.     [provider]  Red Yeast Rice Extract (RED YEAST RICE PO) Take 1 tablet by mouth 3 (three) times daily.     [provider]    Allergies    Patient has no known allergies.  Review of Systems   Review of Systems  All other systems reviewed and are negative.   Physical Exam Updated Vital Signs BP 126/75   Pulse 86   Temp 99.2 F (37.3 C) (Oral)   Resp (!) 23   Ht _0  (1.778 m)   Wt 78 kg   SpO2 94%   BMI 24.68 kg/m   Physical Exam Vitals and nursing note reviewed.  Constitutional:  General: He is not in acute distress.    Appearance: He is well-developed. He is not ill-appearing, toxic-appearing or diaphoretic.  HENT:     Head: Normocephalic and atraumatic.     Right Ear: External ear normal.     Left Ear: External ear normal.     Nose: No congestion or rhinorrhea.     Mouth/Throat:     Mouth: Mucous membranes are moist.     Pharynx: No oropharyngeal exudate.  Eyes:     Conjunctiva/sclera: Conjunctivae normal.     Pupils: Pupils are equal, round, and reactive to light.  Neck:     Trachea: Phonation normal.  Cardiovascular:     Rate and Rhythm: Normal rate and regular rhythm.  Pulmonary:     Effort: Pulmonary effort is normal. No respiratory distress.     Breath sounds: No stridor.  Abdominal:     General: There is no distension.     Palpations: Abdomen is soft.     Tenderness: There is no abdominal tenderness.    Musculoskeletal:        General: Normal range of motion.     Cervical back: Normal range of motion and neck supple.  Skin:    General: Skin is warm and dry.     Comments: Generalized rash of torso characterized by 2 to 3 mm red papules, some of which are excoriated.  There are no associated vesicles, petechiae or drainage.  The rash is nontender to palpation.  Neurological:     Mental Status: He is alert and oriented to person, place, and time.     Cranial Nerves: No cranial nerve deficit.     Sensory: No sensory deficit.     Motor: No abnormal muscle tone.     Coordination: Coordination normal.  Psychiatric:        Mood and Affect: Mood is anxious.        Speech: He is communicative.        Behavior: Behavior normal.        Thought Content: Thought content normal.        Cognition and Memory: Cognition is not impaired. Memory is not impaired.        Judgment: Judgment normal.     ED Results / Procedures / Treatments   Labs (all labs ordered are listed, but only abnormal results are displayed) Labs Reviewed  SARS CORONAVIRUS 2 BY RT PCR ([facility]) - Abnormal; Notable for the following components:      Result Value   SARS Coronavirus 2 POSITIVE (*)    All other components within normal limits  COMPREHENSIVE METABOLIC PANEL - Abnormal; Notable for the following components:   Sodium 124 (*)    Chloride 92 (*)    Glucose, Bld 107 (*)    Calcium 7.4 (*)    Total Protein 6.4 (*)    Albumin 2.9 (*)    AST 66 (*)    Alkaline Phosphatase 33 (*)    All other components within normal limits  C-REACTIVE PROTEIN - Abnormal; Notable for the following components:   CRP 10.8 (*)    All other components within normal limits  CBC WITH DIFFERENTIAL/PLATELET - Abnormal; Notable for the following components:   RBC 3.57 (*)    Hemoglobin 11.0 (*)    HCT 33.0 (*)    Lymphs Abs 0.6 (*)    All other components within normal limits  D-DIMER,  QUANTITATIVE (NOT AT [facility]) - Abnormal;  Notable for the following components:   D-Dimer, Quant 2.67 (*)    All other components within normal limits  FIBRINOGEN - Abnormal; Notable for the following components:   Fibrinogen 760 (*)    All other components within normal limits  FERRITIN - Abnormal; Notable for the following components:   Ferritin 341 (*)    All other components within normal limits  LACTATE DEHYDROGENASE - Abnormal; Notable for the following components:   LDH 403 (*)    All other components within normal limits  PROCALCITONIN  HIV ANTIBODY (ROUTINE TESTING W REFLEX)  ABO/RH    EKG EKG Interpretation  Date/Time:  Sunday November 29 2019 09:03:08 EDT Ventricular Rate:  88 PR Interval:    QRS Duration: 98 QT Interval:  378 QTC Calculation: 458 R Axis:   40 Text Interpretation: Sinus rhythm Low voltage, extremity and precordial leads Baseline wander in lead(s) V6 No old tracing to compare Confirmed by Daleen Bo [phone]) on 11/29/2019 11:34:47 AM   Radiology Portable chest 1 View  Result Date: 11/29/2019 CLINICAL DATA:  Covid-19 symptoms started on 07/27. Fever, insomnia. Rash. Symptoms for 1 week. EXAM: PORTABLE CHEST 1 VIEW COMPARISON:  None. FINDINGS: Heart size is accentuated by technique and probably UPPER limits normal. There patchy airspace filling opacities in the lungs bilaterally, relatively sparing the apices and involving the LEFT lung greater than the RIGHT. No pleural effusions. IMPRESSION: Bilateral infiltrates. Electronically Signed   By: Nolon Nations M.D.   On: 11/29/2019 09:05    Procedures .Critical Care Performed by: Daleen Bo, MD Authorized by: Daleen Bo, MD   Critical care provider statement:    Critical care time (minutes):  35   Critical care start time:  11/29/2019 7:40 AM   Critical care end time:  11/29/2019 12:41 PM   Critical care time was exclusive of:  Separately billable procedures and treating other patients   Critical  care was necessary to treat or prevent imminent or life-threatening deterioration of the following conditions:  Respiratory failure   Critical care was time spent personally by me on the following activities:  Blood draw for specimens, development of treatment plan with patient or surrogate, discussions with consultants, evaluation of patient's response to treatment, examination of patient, obtaining history from patient or surrogate, ordering and performing treatments and interventions, ordering and review of laboratory studies, pulse oximetry, re-evaluation of patient's condition, review of old charts and ordering and review of radiographic studies   (including critical care time)  Medications Ordered in ED Medications  remdesivir 100 mg in sodium chloride 0.9 % 100 mL IVPB (has no administration in time range)  remdesivir 100 mg in sodium chloride 0.9 % 100 mL IVPB (0 mg Intravenous Stopped 11/29/19 0931)    And  remdesivir 100 mg in sodium chloride 0.9 % 100 mL IVPB (0 mg Intravenous Stopped 11/29/19 1052)    ED Course  I have reviewed the triage vital signs and the nursing notes.  Pertinent labs & imaging results that were available during my care of the patient were reviewed by me and considered in my medical decision making (see chart for details).  Clinical Course as of Nov 28 1240  Sun Nov 29, 2019  1233 Bilateral infiltrates, left greater than right, interpreted by me  Portable chest 1 View [EW]    Clinical Course User Index [EW] Daleen Bo, MD   MDM Rules/Calculators/A&P  Patient Vitals for the past 24 hrs:  BP Temp Temp src Pulse Resp SpO2 Height Weight  11/29/19 1130 126/75 -- -- 86 (!) 23 94 % -- --  11/29/19 1010 -- -- -- 77 (!) 25 (!) 87 % -- --  11/29/19 1000 120/66 -- -- 79 (!) 22 (!) 87 % -- --  11/29/19 0900 110/64 -- -- 80 -- 91 % -- --  11/29/19 0744 -- -- -- -- -- -- _0  (1.778 m) 78 kg  11/29/19 0742 103/71 99.2 F (37.3 C)  Oral 90 -- 93 % -- --  11/29/19 0725 102/65 99 F (37.2 C) Oral 82 16 91 % -- --      Medical Decision Making:  This patient is presenting for evaluation of symptomatic Covid infection, which does require a range of treatment options, and is a complaint that involves a high risk of morbidity and mortality. The differential diagnoses include viral infection, bacterial infection, metabolic instability, encephalopathy. I decided to review old records, and in summary elderly male, presenting with subacute Covid infection, symptomatic primarily because of ongoing fever and difficulty sleeping..  I obtained additional historical information from his wife who is also a patient in the emergency department.  Clinical Laboratory Tests Ordered, included CBC, Metabolic panel, Urinalysis and Covid trending labs, Covid test. Review indicates positive Covid PCR testing, elevated fibrinogen, elevated ferritin, elevated C-reactive protein, elevated D-dimer, normal procalcitonin, elevated lactic dehydrogenase, hemoglobin low, sodium low, chloride low, glucose high, calcium low, total protein low, albumin low, AST high, alk phosphatase low. Radiologic Tests Ordered, included chest x-ray.  I independently Visualized: Radiographic images, which show bilateral infiltrates consistent with Covid infection  Cardiac Monitor Tracing which shows normal sinus rhythm   Critical Interventions-clinical evaluation, laboratory testing, monitoring of oxygenation, chest x-ray, EKG, observation reassessment  Alexander Ortega was evaluated in Emergency Department on 11/29/2019 for the symptoms described in the history of present illness. He was evaluated in the context of the global COVID-19 pandemic, which necessitated consideration that the patient might be at risk for infection with the SARS-CoV-2 virus that causes COVID-19. Institutional protocols and algorithms that pertain to the evaluation of patients at risk for COVID-19 are  in a state of rapid change based on information released by regulatory bodies including the CDC and federal and state organizations. These policies and algorithms were followed during the patient's care in the ED.  After These Interventions, the Patient was reevaluated and was found to require admission for hypoxia.  No respiratory distress, oxygenation improved with nasal cannula oxygen.  Doctors bacterial infection, metabolic instability or impending vascular collapse.  No concern for bacterial infection.  Incidental hyponatremia, likely related to the acute viral process.  Patient has mild confusion, without evidence for chronic delirium or dementia.  Suspect this is related to the acute viral Covid infection.   CRITICAL CARE-yes Performed by: Daleen Bo  Nursing Notes Reviewed/ Care Coordinated Applicable Imaging Reviewed Interpretation of Laboratory Data incorporated into ED treatment  12:31 PM-Consult complete with hospitalist. Patient case explained and discussed.  He agrees to admit patient for further evaluation and treatment. Call ended at 12:39 PM    Final Clinical Impression(s) / ED Diagnoses Final diagnoses:  Shortness of breath  COVID-19 virus infection  Hypoxia  Encephalopathy    Rx / DC Orders ED Discharge Orders    None       Daleen Bo, MD 11/29/19 1243

## 2019-11-29 NOTE — ED Notes (Signed)
Pt ambulated in room, on room air, appx 50 feet.  Pt baseline O2 on room air 93%, lowest O2 reading during ambulation was 91%.  Pt able to speak in full sentences throughout, NAD.

## 2019-11-29 NOTE — H&P (Addendum)
[facility] History and Physical  Heru Montz AOZ:[id number] DOB: 15-Dec-1950 DOA: 11/29/2019   PCP: Hulan Fess, MD  Specialists: None  Chief Complaint: Fevers, difficulty sleeping, difficulty breathing  HPI: Alexander Ortega is a 69 y.o. male with no significant past medical history who thinks that he contracted COVID-19 from church gathering about 2 weeks ago.  He has been feeling ill for the past 12 days.  He states that his fever has not subsided and he has been finding it difficult to sleep the last many days.  He has been feeling a little fatigue as well.  Has had very poor oral intake.  Has had some difficulty breathing with occasional cough.  He also developed a rash on his chest and upper back.  Since he was feeling poorly he decided to come to the emergency department.  Denies any chest pain.  Has had nausea but no vomiting.  Has had chills.  No loose stools.  His wife is also sick with COVID-19.  He is not yet vaccinated against COVID-19.  In the emergency department patient was initially saturating in the early 90s but then he was noted to drop down to the late 80s.  He was noted to have low-grade fever.  His inflammatory markers were noted to be elevated.  His sodium was 124.  He was thought to require hospitalization.  Home Medications: Prior to Admission medications   Medication Sig Start Date End Date Taking? Authorizing Provider  amitriptyline (ELAVIL) 25 MG tablet Take 25 mg by mouth at bedtime.      [provider]  aspirin EC 81 MG tablet Take 81 mg by mouth daily.    [provider]  B Complex-C-Folic Acid (MULTIVITAMIN, STRESS FORMULA) tablet Take 1 tablet by mouth daily.      [provider]  KOREAN GINSENG 100 MG CAPS Take by mouth 3 (three) times daily.      [provider]  Melatonin 1 MG TABS Take 1 mg by mouth at bedtime as needed (sleep).     [provider]  Misc Natural Products (LUTEIN 20 PO) Take by mouth 2  (two) times daily.    [provider]  Omega-3 Fatty Acids (FISH OIL) 1000 MG CPDR Take 1,000 mg by mouth daily.     [provider]  Red Yeast Rice Extract (RED YEAST RICE PO) Take 1 tablet by mouth 3 (three) times daily.     [provider]    Allergies: No Known Allergies  Past Medical History: Denies any health problems.  No history of high blood pressure, diabetes, heart disease.  Social History: Lives with his wife.  No history of smoking or illicit drug use.  Usually independent with daily activities.  Family History: History of heart disease in his family.  Review of Systems - History obtained from the patient General ROS: positive for  - chills, fatigue and fever Psychological ROS: negative Ophthalmic ROS: negative ENT ROS: negative Allergy and Immunology ROS: negative Hematological and Lymphatic ROS: negative Endocrine ROS: negative Respiratory ROS: As in HPI Cardiovascular ROS: no chest pain or dyspnea on exertion Gastrointestinal ROS: no abdominal pain, change in bowel habits, or black or bloody stools Genito-Urinary ROS: no dysuria, trouble voiding, or hematuria Musculoskeletal ROS: negative Neurological ROS: no TIA or stroke symptoms Dermatological ROS: negative  Physical Examination  Vitals:   11/29/19 1354 11/29/19 1430 11/29/19 1500 11/29/19 1530  BP:  120/64 127/64 117/65  Pulse: 86 79 91 73  Resp: (!)  23 (!) 25 (!) 28 (!) 22  Temp:      TempSrc:      SpO2: 92% 92% 91% 93%  Weight:      Height:        BP 117/65   Pulse 73   Temp 100.3 F (37.9 C) (Oral)   Resp (!) 22   Ht 5\' 10"  (1.778 m)   Wt 78 kg   SpO2 93%   BMI 24.68 kg/m   General appearance: alert, cooperative, appears stated age and no distress Head: Normocephalic, without obvious abnormality, atraumatic Eyes: conjunctivae/corneas clear. PERRL, EOM's intact. Throat: lips, mucosa, and tongue normal; teeth and gums normal Neck: no adenopathy, no carotid  bruit, no JVD, supple, symmetrical, trachea midline and thyroid not enlarged, symmetric, no tenderness/mass/nodules Resp: Mildly tachypneic at rest.  No use of accessory muscles.  Coarse breath sounds bilaterally with crackles at the bases bilaterally.  No wheezing or rhonchi. Cardio: regular rate and rhythm, S1, S2 normal, no murmur, click, rub or gallop GI: soft, non-tender; bowel sounds normal; no masses,  no organomegaly Extremities: extremities normal, atraumatic, no cyanosis or edema Pulses: 2+ and symmetric Skin: Maculopapular rash noted over the upper back and also in the chest area. Lymph nodes: Cervical, supraclavicular, and axillary nodes normal. Neurologic: No focal deficits.  He is alert and oriented x3.    Labs on Admission: I have personally reviewed following labs and imaging studies  CBC: Recent Labs  Lab 11/29/19 0806  WBC 5.4  NEUTROABS 4.6  HGB 11.0*  HCT 33.0*  MCV 92.4  PLT 595   Basic Metabolic Panel: Recent Labs  Lab 11/29/19 0806  NA 124*  K 4.0  CL 92*  CO2 22  GLUCOSE 107*  BUN 12  CREATININE 0.91  CALCIUM 7.4*   GFR: Estimated Creatinine Clearance: 82.4 mL/min (by C-G formula based on SCr of 0.91 mg/dL). Liver Function Tests: Recent Labs  Lab 11/29/19 0806  AST 66*  ALT 32  ALKPHOS 33*  BILITOT 0.5  PROT 6.4*  ALBUMIN 2.9*   Anemia Panel: Recent Labs    11/29/19 0807  FERRITIN 341*     Radiological Exams on Admission: Portable chest 1 View  Result Date: 11/29/2019 CLINICAL DATA:  Covid-19 symptoms started on 07/27. Fever, insomnia. Rash. Symptoms for 1 week. EXAM: PORTABLE CHEST 1 VIEW COMPARISON:  None. FINDINGS: Heart size is accentuated by technique and probably UPPER limits normal. There patchy airspace filling opacities in the lungs bilaterally, relatively sparing the apices and involving the LEFT lung greater than the RIGHT. No pleural effusions. IMPRESSION: Bilateral infiltrates. Electronically Signed   By: Nolon Nations M.D.   On: 11/29/2019 09:05    My interpretation of Electrocardiogram: Baseline wander noted.  Sinus rhythm in the 80s.  Normal axis.  No concerning ST or T wave changes.  Intervals appear to be normal.  But again not a very good quality EKG.   Problem List  Principal Problem:   Pneumonia due to COVID-19 virus Active Problems:   Acute respiratory failure with hypoxia (HCC)   Rash of back   Hyponatremia   Normocytic anemia   Assessment: This is a 69 year old Caucasian male with no significant past medical history who is not vaccinated against COVID-19 and who thinks he contracted it at a church gathering about 2 weeks ago comes in with many day history of fever chills.  Noted to be hypoxic in the emergency department.  Chest x-ray suggests pneumonia.  He will need hospitalization  for further management.  Plan:  Acute Hypoxic Resp. Failure/Pneumonia due to COVID-19   Recent Labs  Lab 11/29/19 0806 11/29/19 0807  DDIMER 2.67*  --   FERRITIN  --  341*  CRP  --  10.8*  ALT 32  --   PROCALCITON <0.10  --     Objective findings: Fever: Noted to have low-grade fever.  T-max of 100.3. Oxygen requirements: Currently on 3 L of oxygen.  Saturating in the early 90s.  COVID 19 Therapeutics: Antibacterials: None.  Procalcitonin less than 0.1 Remdesivir: Day 1 today Steroids: Solu-Medrol Diuretics: None yet Actemra: None yet PUD Prophylaxis: Pepcid DVT Prophylaxis:  Lovenox  Patient requiring about 3 L of oxygen by nasal cannula.  CRP is 10.8.  We will continue with the Remdesivir and steroids for now.  Actemra was discussed with him and he is agreeable to take it if indicated.  If his oxygen requirements climb beyond 4 to 5 L then we might consider using Actemra.  Monitor and trend inflammatory markers.  Incentive spirometry flutter valve.  Prone position also discussed with the patient.  Mobilize.  AST noted to be mildly elevated and will be trended. D dimer elevation due to  covid. Continue to trend. Consider thromboembolic work up if continues to trend higher.  Hyponatremia Most likely due to hypovolemia since his oral intake has been poor the last several days.  He has had some nausea but no vomiting per se.  We will give him normal saline infusion for 24 hours.  Recheck his sodium level tomorrow.  Urine sodium and urine osmolality.  Normocytic anemia Check anemia panel in the morning.  Skin rash Reason for his rash on the upper back and chest is not clear.  Not really itchy.  Denies any change in his medications recently.  Can give a trial of steroid cream.  DVT Prophylaxis: Lovenox Code Status: Full code Family Communication: Discussed with the patient Disposition: Hopefully return home when improved Consults called: None Admission Status: Status is: Inpatient  Remains inpatient appropriate because:IV treatments appropriate due to intensity of illness or inability to take PO and Inpatient level of care appropriate due to severity of illness   Dispo: The patient is from: Home              Anticipated d/c is to: Home              Anticipated d/c date is: 3 days              Patient currently is not medically stable to d/c.   Severity of Illness: The appropriate patient status for this patient is INPATIENT. Inpatient status is judged to be reasonable and necessary in order to provide the required intensity of service to ensure the patient's safety. The patient's presenting symptoms, physical exam findings, and initial radiographic and laboratory data in the context of their chronic comorbidities is felt to place them at high risk for further clinical deterioration. Furthermore, it is not anticipated that the patient will be medically stable for discharge from the hospital within 2 midnights of admission. The following factors support the patient status of inpatient.   " The patient's presenting symptoms include fever chills shortness of breath. " The  worrisome physical exam findings include hypoxia. " The initial radiographic and laboratory data are worrisome because of pneumonia due to COVID-19. " The chronic co-morbidities include none.   * I certify that at the point of admission it is my clinical judgment that  the patient will require inpatient hospital care spanning beyond 2 midnights from the point of admission due to high intensity of service, high risk for further deterioration and high frequency of surveillance required.*  Further management decisions will depend on results of further testing and patient's response to treatment.   Anzleigh Slaven [employer]  [facility] operational officer on [email or web address]  11/29/2019, 5:00 PM

## 2019-11-29 NOTE — ED Notes (Signed)
Admitting provider at bedside.

## 2019-11-29 NOTE — ED Notes (Signed)
While pt asleep, O2 sats on room air decreased to 85%.  Pt placed on 3L O2 via [place], O2 increased to 96%.  Will continue to monitor.

## 2019-11-30 ENCOUNTER — Encounter (HOSPITAL_COMMUNITY): Payer: Self-pay | Admitting: Internal Medicine

## 2019-11-30 DIAGNOSIS — U071 COVID-19: Principal | ICD-10-CM

## 2019-11-30 DIAGNOSIS — J1282 Pneumonia due to coronavirus disease 2019: Secondary | ICD-10-CM

## 2019-11-30 LAB — CBC WITH DIFFERENTIAL/PLATELET
Abs Immature Granulocytes: 0.03 10*3/uL (ref 0.00–0.07)
Basophils Absolute: 0 10*3/uL (ref 0.0–0.1)
Basophils Relative: 0 %
Eosinophils Absolute: 0 10*3/uL (ref 0.0–0.5)
Eosinophils Relative: 0 %
HCT: 29.3 % — ABNORMAL LOW (ref 39.0–52.0)
Hemoglobin: 9.9 g/dL — ABNORMAL LOW (ref 13.0–17.0)
Immature Granulocytes: 1 %
Lymphocytes Relative: 10 %
Lymphs Abs: 0.5 10*3/uL — ABNORMAL LOW (ref 0.7–4.0)
MCH: 31.2 pg (ref 26.0–34.0)
MCHC: 33.8 g/dL (ref 30.0–36.0)
MCV: 92.4 fL (ref 80.0–100.0)
Monocytes Absolute: 0.1 10*3/uL (ref 0.1–1.0)
Monocytes Relative: 3 %
Neutro Abs: 4.6 10*3/uL (ref 1.7–7.7)
Neutrophils Relative %: 86 %
Platelets: 189 10*3/uL (ref 150–400)
RBC: 3.17 MIL/uL — ABNORMAL LOW (ref 4.22–5.81)
RDW: 13.2 % (ref 11.5–15.5)
WBC: 5.3 10*3/uL (ref 4.0–10.5)
nRBC: 0 % (ref 0.0–0.2)

## 2019-11-30 LAB — IRON AND TIBC
Iron: 18 ug/dL — ABNORMAL LOW (ref 45–182)
Saturation Ratios: 11 % — ABNORMAL LOW (ref 17.9–39.5)
TIBC: 169 ug/dL — ABNORMAL LOW (ref 250–450)
UIBC: 151 ug/dL

## 2019-11-30 LAB — COMPREHENSIVE METABOLIC PANEL
ALT: 39 U/L (ref 0–44)
AST: 63 U/L — ABNORMAL HIGH (ref 15–41)
Albumin: 2.2 g/dL — ABNORMAL LOW (ref 3.5–5.0)
Alkaline Phosphatase: 31 U/L — ABNORMAL LOW (ref 38–126)
Anion gap: 8 (ref 5–15)
BUN: 16 mg/dL (ref 8–23)
CO2: 21 mmol/L — ABNORMAL LOW (ref 22–32)
Calcium: 6.8 mg/dL — ABNORMAL LOW (ref 8.9–10.3)
Chloride: 104 mmol/L (ref 98–111)
Creatinine, Ser: 0.78 mg/dL (ref 0.61–1.24)
GFR calc Af Amer: >60 mL/min (ref >60)
GFR calc non Af Amer: >60 mL/min (ref >60)
Glucose, Bld: 145 mg/dL — ABNORMAL HIGH (ref 70–99)
Potassium: 3.9 mmol/L (ref 3.5–5.1)
Sodium: 133 mmol/L — ABNORMAL LOW (ref 135–145)
Total Bilirubin: 0.3 mg/dL (ref 0.3–1.2)
Total Protein: 5.6 g/dL — ABNORMAL LOW (ref 6.5–8.1)

## 2019-11-30 LAB — RETICULOCYTES
Immature Retic Fract: 7.3 % (ref 2.3–15.9)
RBC.: 3.21 MIL/uL — ABNORMAL LOW (ref 4.22–5.81)
Retic Ct Pct: <0.4 % — ABNORMAL LOW (ref 0.4–3.1)

## 2019-11-30 LAB — C-REACTIVE PROTEIN: CRP: 10.7 mg/dL — ABNORMAL HIGH (ref <1.0)

## 2019-11-30 LAB — D-DIMER, QUANTITATIVE: D-Dimer, Quant: 2 ug/mL-FEU — ABNORMAL HIGH (ref 0.00–0.50)

## 2019-11-30 LAB — VITAMIN B12: Vitamin B-12: 1631 pg/mL — ABNORMAL HIGH (ref 180–914)

## 2019-11-30 LAB — MAGNESIUM: Magnesium: 2.2 mg/dL (ref 1.7–2.4)

## 2019-11-30 LAB — FOLATE: Folate: 16 ng/mL (ref >5.9)

## 2019-11-30 NOTE — Progress Notes (Signed)
PROGRESS NOTE    Alexander Ortega  VQM:[id number] DOB: 1950-08-16 DOA: 11/29/2019 PCP: Hulan Fess, MD   Brief Narrative:  Alexander Ortega is a 69 y.o. male with no significant past medical history who thinks that he contracted COVID-19 from church gathering about 2 weeks ago.  He has been feeling ill for the past 12 days.  He states that his fever has not subsided and he has been finding it difficult to sleep the last many days.  He has been feeling a little fatigue as well.  Has had very poor oral intake.  Has had some difficulty breathing with occasional cough.  He also developed a rash on his chest and upper back.  Since he was feeling poorly he decided to come to the emergency department.  Denies any chest pain.  Has had nausea but no vomiting.  Has had chills.  No loose stools.  His wife is also sick with COVID-19.  He is not yet vaccinated against COVID-19.  In the emergency department patient was initially saturating in the early 90s but then he was noted to drop down to the late 80s.  He was noted to have low-grade fever.  His inflammatory markers were noted to be elevated.  His sodium was 124.  He was thought to require hospitalization.   Assessment & Plan:   Principal Problem:   Pneumonia due to COVID-19 virus Active Problems:   Acute respiratory failure with hypoxia (HCC)   Rash of back   Hyponatremia   Normocytic anemia   Acute Hypoxic Resp. Failure/Pneumonia due to COVID-19 SpO2: 93 % O2 Flow Rate (L/min): 4 L/min Antibacterials: None.  Procalcitonin less than 0.1 Remdesivir: 8/8--12 Steroids: Solu-Medrol Diuretics: None yet Actemra: None yet, continues to improve - no indication at this time (patient did agree to dose if clinically worsens) PUD Prophylaxis: Pepcid DVT Prophylaxis:  Lovenox Recent Labs    11/29/19 0806 11/29/19 0807 11/30/19 0406  DDIMER 2.67*  --  2.00*  FERRITIN  --  341*  --   LDH 403*  --   --   CRP  --  10.8* 10.7*    Lab Results    Component Value Date   SARSCOV2NAA POSITIVE (A) 11/29/2019    Hyponatremia, hypovolemic Patient reports very poor p.o. intake over the past week Denies nausea vomiting or diarrhea Encourage increased p.o. intake  Normocytic anemia, iron deficiency Iron deficient, will recommend iron replacement at discharge; B12 and folate within normal limits No acute signs or symptoms of bleeding Downtrending hemoglobin over the past 24 hours likely hemodilutional given IV fluids  Skin rash, unclear etiology Blanching erythematous rash appears to be on back most notably and minimally noted on the trunk anteriorly but otherwise spares the limbs face and neck Patient declines any recent detergent, soap, clothing or personal hygiene changes Denies any change in his medications recently Continues to improve with IV steroids, topical steroids as needed    DVT Prophylaxis: Lovenox Code Status: Full code Family Communication: Discussed with the patient Disposition: Hopefully return home when improved Consults called: None Admission Status: Status is: Inpatient  Remains inpatient appropriate because:IV treatments appropriate due to intensity of illness or inability to take PO and Inpatient level of care appropriate due to severity of illness  Dispo: The patient is from: Home  Anticipated d/c is to: Home  Anticipated d/c date is: 3 days  Patient currently is not medically stable to d/c.  Subjective: No acute issues or events overnight, feels drastically improved over the  past 24 hours with IV steroids and remdesivir.  Oxygen weaning down, clinically improving hold off on Actemra.  Patient markedly anxious as his wife has now returned back to the ED via ambulance after calling 911 at home given worsening shortness of breath.  Patient otherwise denies chest pain, nausea, vomiting, diarrhea, constipation, headache, fevers, chills.  He does note his rash feels to be  improving, less itchy or irritating over the past few hours as patient was able to sleep throughout the night for once.  Objective: Vitals:   11/30/19 0530 11/30/19 0600 11/30/19 0630 11/30/19 0746  BP: 124/74 100/62 (!) 115/56 121/70  Pulse: 92 61 82 75  Resp: (!) 22 17 20  (!) 23  Temp:   99.1 F (37.3 C) 99.1 F (37.3 C)  TempSrc:   Oral Oral  SpO2: 92% 92% 96% 93%  Weight:      Height:        Intake/Output Summary (Last 24 hours) at 11/30/2019 0823 Last data filed at 11/29/2019 1158 Gross per 24 hour  Intake 200 ml  Output 751 ml  Net -551 ml   Filed Weights   11/29/19 0744  Weight: 78 kg    Examination:  General exam: Appears calm and comfortable  Respiratory system: Clear to auscultation. Respiratory effort normal. Cardiovascular system: S1 & S2 heard, RRR. No JVD, murmurs, rubs, gallops or clicks. No pedal edema. Gastrointestinal system: Abdomen is nondistended, soft and nontender. No organomegaly or masses felt. Normal bowel sounds heard. Central nervous system: Alert and oriented. No focal neurological deficits. Extremities: Symmetric 5 x 5 power. Skin: Blanching erythematous rash noted over the trunk posteriorly more than anteriorly sparing the limbs Psychiatry: Judgement and insight appear normal.  Somewhat anxious but otherwise mood and affect appropriate.  Data Reviewed: I have personally reviewed following labs and imaging studies  CBC: Recent Labs  Lab 11/29/19 0806 11/30/19 0406  WBC 5.4 5.3  NEUTROABS 4.6 4.6  HGB 11.0* 9.9*  HCT 33.0* 29.3*  MCV 92.4 92.4  PLT 194 557   Basic Metabolic Panel: Recent Labs  Lab 11/29/19 0806 11/30/19 0406  NA 124* 133*  K 4.0 3.9  CL 92* 104  CO2 22 21*  GLUCOSE 107* 145*  BUN 12 16  CREATININE 0.91 0.78  CALCIUM 7.4* 6.8*  MG  --  2.2   GFR: Estimated Creatinine Clearance: 93.8 mL/min (by C-G formula based on SCr of 0.78 mg/dL). Liver Function Tests: Recent Labs  Lab 11/29/19 0806 11/30/19 0406    AST 66* 63*  ALT 32 39  ALKPHOS 33* 31*  BILITOT 0.5 0.3  PROT 6.4* 5.6*  ALBUMIN 2.9* 2.2*   No results for input(s): LIPASE, AMYLASE in the last 168 hours. No results for input(s): AMMONIA in the last 168 hours. Coagulation Profile: No results for input(s): INR, PROTIME in the last 168 hours. Cardiac Enzymes: No results for input(s): CKTOTAL, CKMB, CKMBINDEX, TROPONINI in the last 168 hours. BNP (last 3 results) No results for input(s): PROBNP in the last 8760 hours. HbA1C: No results for input(s): HGBA1C in the last 72 hours. CBG: No results for input(s): GLUCAP in the last 168 hours. Lipid Profile: No results for input(s): CHOL, HDL, LDLCALC, TRIG, CHOLHDL, LDLDIRECT in the last 72 hours. Thyroid Function Tests: No results for input(s): TSH, T4TOTAL, FREET4, T3FREE, THYROIDAB in the last 72 hours. Anemia Panel: Recent Labs    11/29/19 0807 11/30/19 0406  FOLATE  --  16.0  FERRITIN 341*  --   RETICCTPCT  --  <  0.4*   Sepsis Labs: Recent Labs  Lab 11/29/19 0806  PROCALCITON <0.10    Recent Results (from the past 240 hour(s))  SARS Coronavirus 2 by RT PCR (hospital order, performed in [facility] hospital lab) Nasopharyngeal Nasopharyngeal Swab     Status: Abnormal   Collection Time: 11/29/19  8:09 AM   Specimen: Nasopharyngeal Swab  Result Value Ref Range Status   SARS Coronavirus 2 POSITIVE (A) NEGATIVE Final    Comment: RESULT CALLED TO, READ BACK BY AND VERIFIED WITH: Lessie Dings RN [id number] 11/29/19 JM (NOTE) SARS-CoV-2 target nucleic acids are DETECTED  SARS-CoV-2 RNA is generally detectable in upper respiratory specimens  during the acute phase of infection.  Positive results are indicative  of the presence of the identified virus, but do not rule out bacterial infection or co-infection with other pathogens not detected by the test.  Clinical correlation with patient history and  other diagnostic information is necessary to determine patient infection  status.  The expected result is negative.  Fact Sheet for Patients:   StrictlyIdeas.no   Fact Sheet for Healthcare Providers:   BankingDealers.co.za    This test is not yet approved or cleared by the [place] FDA and  has been authorized for detection and/or diagnosis of SARS-CoV-2 by FDA under an Emergency Use Authorization (EUA).  This EUA will remain in effect (meaning this test ca n be used) for the duration of  the COVID-19 declaration under Section 564(b)(1) of the Act, 21 U.S.C. section 360-bbb-3(b)(1), unless the authorization is terminated or revoked sooner.  Performed at [facility], [place] [address] [place], [place] [address]     Radiology Studies: Portable chest 1 View  Result Date: 11/29/2019 CLINICAL DATA:  Covid-19 symptoms started on 07/27. Fever, insomnia. Rash. Symptoms for 1 week. EXAM: PORTABLE CHEST 1 VIEW COMPARISON:  None. FINDINGS: Heart size is accentuated by technique and probably UPPER limits normal. There patchy airspace filling opacities in the lungs bilaterally, relatively sparing the apices and involving the LEFT lung greater than the RIGHT. No pleural effusions. IMPRESSION: Bilateral infiltrates. Electronically Signed   By: Nolon Nations M.D.   On: 11/29/2019 09:05   Scheduled Meds: . amitriptyline  25 mg Oral QHS  . vitamin C  500 mg Oral Daily  . enoxaparin (LOVENOX) injection  40 mg Subcutaneous Q24H  . famotidine  20 mg Oral Daily  . methylPREDNISolone (SOLU-MEDROL) injection  60 mg Intravenous Q12H  . triamcinolone cream   Topical TID  . zinc sulfate  220 mg Oral Daily   Continuous Infusions: . sodium chloride 100 mL/hr at 11/30/19 0128  . remdesivir 100 mg in NS 100 mL       LOS: 1 day   Time spent: 68min  Latona Krichbaum C Naja Apperson, DO [facility]  If 7PM-7AM, please contact night-coverage [email or web address]  11/30/2019, 8:23 AM

## 2019-11-30 NOTE — ED Notes (Signed)
Ice packs has been provided per patient's request.

## 2019-11-30 NOTE — ED Notes (Signed)
Assumed care of patient at this time, nad noted, sr up x2, bed locked and low, call bell w/I reach.  Will continue to monitor. ° °

## 2019-11-30 NOTE — ED Notes (Signed)
Attempted call report, RN busy at this time.  Will call back.

## 2019-12-01 LAB — CBC WITH DIFFERENTIAL/PLATELET
Abs Immature Granulocytes: 0.1 10*3/uL — ABNORMAL HIGH (ref 0.00–0.07)
Basophils Absolute: 0 10*3/uL (ref 0.0–0.1)
Basophils Relative: 0 %
Eosinophils Absolute: 0 10*3/uL (ref 0.0–0.5)
Eosinophils Relative: 0 %
HCT: 32.9 % — ABNORMAL LOW (ref 39.0–52.0)
Hemoglobin: 10.8 g/dL — ABNORMAL LOW (ref 13.0–17.0)
Immature Granulocytes: 1 %
Lymphocytes Relative: 7 %
Lymphs Abs: 0.8 10*3/uL (ref 0.7–4.0)
MCH: 30.7 pg (ref 26.0–34.0)
MCHC: 32.8 g/dL (ref 30.0–36.0)
MCV: 93.5 fL (ref 80.0–100.0)
Monocytes Absolute: 0.3 10*3/uL (ref 0.1–1.0)
Monocytes Relative: 3 %
Neutro Abs: 9.6 10*3/uL — ABNORMAL HIGH (ref 1.7–7.7)
Neutrophils Relative %: 89 %
Platelets: 255 10*3/uL (ref 150–400)
RBC: 3.52 MIL/uL — ABNORMAL LOW (ref 4.22–5.81)
RDW: 13.4 % (ref 11.5–15.5)
WBC: 10.8 10*3/uL — ABNORMAL HIGH (ref 4.0–10.5)
nRBC: 0 % (ref 0.0–0.2)

## 2019-12-01 LAB — COMPREHENSIVE METABOLIC PANEL
ALT: 40 U/L (ref 0–44)
AST: 52 U/L — ABNORMAL HIGH (ref 15–41)
Albumin: 2.7 g/dL — ABNORMAL LOW (ref 3.5–5.0)
Alkaline Phosphatase: 37 U/L — ABNORMAL LOW (ref 38–126)
Anion gap: 13 (ref 5–15)
BUN: 17 mg/dL (ref 8–23)
CO2: 21 mmol/L — ABNORMAL LOW (ref 22–32)
Calcium: 7.7 mg/dL — ABNORMAL LOW (ref 8.9–10.3)
Chloride: 107 mmol/L (ref 98–111)
Creatinine, Ser: 0.91 mg/dL (ref 0.61–1.24)
GFR calc Af Amer: >60 mL/min (ref >60)
GFR calc non Af Amer: >60 mL/min (ref >60)
Glucose, Bld: 159 mg/dL — ABNORMAL HIGH (ref 70–99)
Potassium: 5 mmol/L (ref 3.5–5.1)
Sodium: 141 mmol/L (ref 135–145)
Total Bilirubin: 0.5 mg/dL (ref 0.3–1.2)
Total Protein: 5.9 g/dL — ABNORMAL LOW (ref 6.5–8.1)

## 2019-12-01 LAB — C-REACTIVE PROTEIN: CRP: 4.5 mg/dL — ABNORMAL HIGH (ref <1.0)

## 2019-12-01 LAB — D-DIMER, QUANTITATIVE: D-Dimer, Quant: 1.87 ug/mL-FEU — ABNORMAL HIGH (ref 0.00–0.50)

## 2019-12-01 LAB — MAGNESIUM: Magnesium: 2.5 mg/dL — ABNORMAL HIGH (ref 1.7–2.4)

## 2019-12-01 MED ORDER — ORAL CARE MOUTH RINSE
15.0000 mL | Freq: Two times a day (BID) | OROMUCOSAL | Status: DC
  Administered 2019-12-01: 15 mL via OROMUCOSAL

## 2019-12-01 MED ORDER — TRIAMCINOLONE ACETONIDE 0.5 % EX CREA: TOPICAL_CREAM | Freq: Three times a day (TID) | CUTANEOUS | 0 refills | Status: DC

## 2019-12-01 MED ORDER — PREDNISONE 10 MG PO TABS: ORAL_TABLET | ORAL | 0 refills | Status: AC

## 2019-12-01 NOTE — Discharge Instructions (Signed)
Patient scheduled for outpatient Remdesivir infusion at 2pm on Wednesday 8/11 and Thursday 8/12 at [facility]. Please inform the patient to park at [place], as staff will be escorting the patient through the [place] entrance of the hospital.    There is a wave flag banner located near the entrance on [address]. Turn into this entrance and immediately turn left and park in 1 of the 5 designated Covid Infusion Parking spots. There is a phone number on the sign, please call and let the staff know what spot you are in and we will come out and get you. For questions call [phone].  Thanks.  * If patient is getting dropped off, you may pull up to the main entrance at [place]. Please stay in your car and call [phone] and let staff know what car you're in. We'll come out and escort you through the hospital.

## 2019-12-01 NOTE — Progress Notes (Addendum)
Patient scheduled for outpatient Remdesivir infusion at 2pm on Wednesday 8/11 and Thursday 8/12 at [facility]. Please inform the patient to park at [place], as staff will be escorting the patient through the [place] entrance of the hospital.    There is a wave flag banner located near the entrance on [address]. Turn into this entrance and immediately turn left and park in 1 of the 5 designated Covid Infusion Parking spots. There is a phone number on the sign, please call and let the staff know what spot you are in and we will come out and get you. For questions call [phone].  Thanks.  * If patient is getting dropped off, you may pull up to the main entrance at [place]. Please stay in your car and call [phone] and let staff know what car you're in. We'll come out and escort you through the hospital.

## 2019-12-01 NOTE — TOC Transition Note (Signed)
Transition of Care [facility]) - CM/SW Discharge Note   Patient Details  Name: Alexander Ortega MRN: [id number] Date of Birth: 1950-10-19  Transition of Care [facility] CM/SW Contact:  Ross Ludwig, LCSW Phone Number: 12/01/2019, 5:37 PM   Clinical Narrative:     Patient is a 69 year old male who is married and lives with his wife who also has Covid.  CSW was informed that patient needs oxygen, CSW contacted [employer] to order the oxygen for patient.  Per DME patient's oxygen will be delivered today before he leaves.  CSW updated bedside nurse that oxygen will be delivered today.  CSW signing off please reconsult if social work needs arise.  Final next level of care: Home/Self Care Barriers to Discharge: Barriers Resolved   Patient Goals and CMS Choice Patient states their goals for this hospitalization and ongoing recovery are:: To return back home and recover from Covid. CMS Medicare.gov Compare Post Acute Care list provided to:: Patient Choice offered to / list presented to : Patient  Discharge Placement  Patient discharging home with oxygen.                     Discharge Plan and Services                DME Arranged: Oxygen DME Agency: [employer] Date DME Agency Contacted: 12/01/19 Time DME Agency Contacted: 1500 Representative spoke with at DME Agency: Thedore Mins HH Arranged: NA          Social Determinants of Health ([place]) Interventions     Readmission Risk Interventions No flowsheet data found.

## 2019-12-01 NOTE — Plan of Care (Signed)

## 2019-12-01 NOTE — Progress Notes (Signed)
Left via taxi with home oxygen machine and oxygen canister.

## 2019-12-01 NOTE — Progress Notes (Signed)
SATURATION QUALIFICATIONS: (This note is used to comply with regulatory documentation for home oxygen)  Patient Saturations on Room Air at Rest = 93%  Patient Saturations on Room Air while Ambulating = 84%  Patient Saturations on 2 Liters of oxygen while Ambulating = 92%  Please briefly explain why patient needs home oxygen:

## 2019-12-01 NOTE — Discharge Summary (Signed)
Physician Discharge Summary  Alexander Ortega KZS:[id number] DOB: [birth year] DOA: 11/29/2019  PCP: Hulan Fess, MD  Admit date: 11/29/2019 Discharge date: 12/01/2019  Admitted From: Home Disposition:  Home  Recommendations for Outpatient Follow-up:  1. Follow up with PCP in 1-2 weeks 2. Please obtain BMP/CBC in one week 3. Please follow up on the following pending results:  Home Health: None Equipment/Devices: Oxygen  Discharge Condition: Stable CODE STATUS: Full Diet recommendation: Regular diet as tolerated  Brief/Interim Summary: Alexander Holmquistis a 69 y.o.malewith no significant past medical history who thinks that he contracted COVID-19 from church gathering about 2 weeks ago. He has been feeling ill for the past 12 days. He states that his fever has not subsided and he has been finding it difficult to sleep the last many days. He has been feeling a little fatigue as well. Has had very poor oral intake. Has had some difficulty breathing with occasional cough. He also developed a rash on his chest and upper back. Since he was feeling poorly he decided to come to the emergency department. Denies any chest pain. Has had nausea but no vomiting. Has had chills. No loose stools. His wife is also sick with COVID-19. He is not yet vaccinated against COVID-19. In the emergency department patient was initially saturating in the early 90s but then he was noted to drop down to the late 80s. He was noted to have low-grade fever. His inflammatory markers were noted to be elevated. His sodium was 124. He was thought to require hospitalization.  Patient met as above with acute hypoxic respiratory failure in setting of COVID-19 pneumonia.  Patient was treated with Remdesivir, methylprednisolone with remarkably rapid improvement over the past 24 hours.  At this time patient remains without hypoxia at rest, with exertion patient does desat 84% requiring 2 L nasal cannula to maintain  saturations above 90%.  Patient otherwise feels quite well, anxious to discharge home to take care of his wife who is also Covid positive but able to stay home.  He otherwise will need close follow-up with PCP to continue to manage hypoxia and wean oxygen as his COVID pneumonia resolves.  He has 2 remaining doses of Remdesivir on the 11th and 12th which he will receive in the outpatient infusion center as per discharge documentation.  Patient otherwise stable and agreeable for discharge home.  Discharge Diagnoses:  Principal Problem:   Pneumonia due to COVID-19 virus Active Problems:   Acute respiratory failure with hypoxia (HCC)   Rash of back   Hyponatremia   Normocytic anemia   Discharge Instructions  Discharge Instructions    Call MD for:  difficulty breathing, headache or visual disturbances   Complete by: As directed    Call MD for:  persistant dizziness or light-headedness   Complete by: As directed    Call MD for:  persistant nausea and vomiting   Complete by: As directed    Call MD for:  temperature >100.4   Complete by: As directed    Diet - low sodium heart healthy   Complete by: As directed    Increase activity slowly   Complete by: As directed      Allergies as of 12/01/2019   No Known Allergies     Medication List    TAKE these medications   acetaminophen 325 MG tablet Commonly known as: TYLENOL Take 650 mg by mouth every 6 (six) hours as needed for mild pain or headache.   alfuzosin 10 MG 24 hr tablet  Commonly known as: UROXATRAL Take 10 mg by mouth daily.   amitriptyline 25 MG tablet Commonly known as: ELAVIL Take 12.5 mg by mouth at bedtime.   Fish Oil 1000 MG Cpdr Take 1,000 mg by mouth 2 (two) times daily.   GLUCOSAMINE PO Take 1 tablet by mouth 2 (two) times daily.   LUTEIN 20 PO Take 1 tablet by mouth 2 (two) times daily.   MAGNESIUM PO Take 1 tablet by mouth daily.   melatonin 1 MG Tabs tablet Take 2 mg by mouth at bedtime.    multivitamin, stress formula tablet Take 1 tablet by mouth daily.   polyvinyl alcohol 1.4 % ophthalmic solution Commonly known as: LIQUIFILM TEARS Place 1 drop into both eyes as needed for dry eyes.   predniSONE 10 MG tablet Commonly known as: DELTASONE Take 4 tablets (40 mg total) by mouth daily for 3 days, THEN 3 tablets (30 mg total) daily for 3 days, THEN 2 tablets (20 mg total) daily for 3 days, THEN 1 tablet (10 mg total) daily for 3 days. Start taking on: December 01, 2019   rosuvastatin 5 MG tablet Commonly known as: CRESTOR Take 5 mg by mouth daily.   triamcinolone cream 0.5 % Commonly known as: KENALOG Apply topically 3 (three) times daily.            Durable Medical Equipment  (From admission, onward)         Start     Ordered   12/01/19 1435  DME Oxygen  Once       Question Answer Comment  Length of Need 6 Months   Mode or (Route) Nasal cannula   Liters per Minute 2   Frequency Continuous (stationary and portable oxygen unit needed)   Oxygen conserving device No   Oxygen delivery system Gas      12/01/19 1434          No Known Allergies  Consultations:  None   Procedures/Studies: Portable chest 1 View  Result Date: 11/29/2019 CLINICAL DATA:  Covid-19 symptoms started on 07/27. Fever, insomnia. Rash. Symptoms for 1 week. EXAM: PORTABLE CHEST 1 VIEW COMPARISON:  None. FINDINGS: Heart size is accentuated by technique and probably UPPER limits normal. There patchy airspace filling opacities in the lungs bilaterally, relatively sparing the apices and involving the LEFT lung greater than the RIGHT. No pleural effusions. IMPRESSION: Bilateral infiltrates. Electronically Signed   By: Nolon Nations M.D.   On: 11/29/2019 09:05     Subjective: No acute issues or events overnight, patient states he feels quite improved, not yet back to baseline with exertion but otherwise feels very well, tolerating p.o. without difficulty denies nausea, vomiting,  diarrhea, constipation, headache, fevers, chills.  Discharge Exam: Vitals:   12/01/19 1338 12/01/19 1346  BP:    Pulse:    Resp:    Temp:    SpO2: 93% (!) 84%   Vitals:   12/01/19 0907 12/01/19 1200 12/01/19 1338 12/01/19 1346  BP: 118/74 128/79    Pulse: 76 74    Resp: 20 20    Temp: 98.6 F (37 C) 98.8 F (37.1 C)    TempSrc: Oral Oral    SpO2: 90% 95% 93% (!) 84%  Weight:      Height:        General: Pt is alert, awake, not in acute distress Cardiovascular: RRR, S1/S2 +, no rubs, no gallops Respiratory: Scant bibasilar rhonchi without overt wheeze or rales Abdominal: Soft, NT, ND, bowel sounds +  Extremities: no edema, no cyanosis    The results of significant diagnostics from this hospitalization (including imaging, microbiology, ancillary and laboratory) are listed below for reference.     Microbiology: Recent Results (from the past 240 hour(s))  SARS Coronavirus 2 by RT PCR (hospital order, performed in [facility] hospital lab) Nasopharyngeal Nasopharyngeal Swab     Status: Abnormal   Collection Time: 11/29/19  8:09 AM   Specimen: Nasopharyngeal Swab  Result Value Ref Range Status   SARS Coronavirus 2 POSITIVE (A) NEGATIVE Final    Comment: RESULT CALLED TO, READ BACK BY AND VERIFIED WITH: Lessie Dings RN [id number] 11/29/19 JM (NOTE) SARS-CoV-2 target nucleic acids are DETECTED  SARS-CoV-2 RNA is generally detectable in upper respiratory specimens  during the acute phase of infection.  Positive results are indicative  of the presence of the identified virus, but do not rule out bacterial infection or co-infection with other pathogens not detected by the test.  Clinical correlation with patient history and  other diagnostic information is necessary to determine patient infection status.  The expected result is negative.  Fact Sheet for Patients:   StrictlyIdeas.no   Fact Sheet for Healthcare Providers:    BankingDealers.co.za    This test is not yet approved or cleared by the [place] FDA and  has been authorized for detection and/or diagnosis of SARS-CoV-2 by FDA under an Emergency Use Authorization (EUA).  This EUA will remain in effect (meaning this test ca n be used) for the duration of  the COVID-19 declaration under Section 564(b)(1) of the Act, 21 U.S.C. section 360-bbb-3(b)(1), unless the authorization is terminated or revoked sooner.  Performed at [facility], [place] [address], [place], [place] [address]      Labs: BNP (last 3 results) No results for input(s): BNP in the last 8760 hours. Basic Metabolic Panel: Recent Labs  Lab 11/29/19 0806 11/30/19 0406 12/01/19 0406  NA 124* 133* 141  K 4.0 3.9 5.0  CL 92* 104 107  CO2 22 21* 21*  GLUCOSE 107* 145* 159*  BUN _0 CREATININE 0.91 0.78 0.91  CALCIUM 7.4* 6.8* 7.7*  MG  --  2.2 2.5*   Liver Function Tests: Recent Labs  Lab 11/29/19 0806 11/30/19 0406 12/01/19 0406  AST 66* 63* 52*  ALT 32 39 40  ALKPHOS 33* 31* 37*  BILITOT 0.5 0.3 0.5  PROT 6.4* 5.6* 5.9*  ALBUMIN 2.9* 2.2* 2.7*   No results for input(s): LIPASE, AMYLASE in the last 168 hours. No results for input(s): AMMONIA in the last 168 hours. CBC: Recent Labs  Lab 11/29/19 0806 11/30/19 0406 12/01/19 0406  WBC 5.4 5.3 10.8*  NEUTROABS 4.6 4.6 9.6*  HGB 11.0* 9.9* 10.8*  HCT 33.0* 29.3* 32.9*  MCV 92.4 92.4 93.5  PLT 194 189 255   Cardiac Enzymes: No results for input(s): CKTOTAL, CKMB, CKMBINDEX, TROPONINI in the last 168 hours. BNP: Invalid input(s): POCBNP CBG: No results for input(s): GLUCAP in the last 168 hours. D-Dimer Recent Labs    11/30/19 0406 12/01/19 0406  DDIMER 2.00* 1.87*   Hgb A1c No results for input(s): HGBA1C in the last 72 hours. Lipid Profile No results for input(s): CHOL, HDL, LDLCALC, TRIG, CHOLHDL, LDLDIRECT in the last 72 hours. Thyroid function  studies No results for input(s): TSH, T4TOTAL, T3FREE, THYROIDAB in the last 72 hours.  Invalid input(s): FREET3 Anemia work up [employer]    11/29/19 0807 11/30/19 0406  VITAMINB12  --  1,631*  FOLATE  --  16.0  FERRITIN 341*  --   TIBC  --  169*  IRON  --  18*  RETICCTPCT  --  <0.4*   Urinalysis No results found for: COLORURINE, APPEARANCEUR, LABSPEC, PHURINE, GLUCOSEU, HGBUR, BILIRUBINUR, KETONESUR, PROTEINUR, UROBILINOGEN, NITRITE, LEUKOCYTESUR Sepsis Labs Invalid input(s): PROCALCITONIN,  WBC,  LACTICIDVEN Microbiology Recent Results (from the past 240 hour(s))  SARS Coronavirus 2 by RT PCR (hospital order, performed in [facility] hospital lab) Nasopharyngeal Nasopharyngeal Swab     Status: Abnormal   Collection Time: 11/29/19  8:09 AM   Specimen: Nasopharyngeal Swab  Result Value Ref Range Status   SARS Coronavirus 2 POSITIVE (A) NEGATIVE Final    Comment: RESULT CALLED TO, READ BACK BY AND VERIFIED WITH: Lessie Dings RN [id number] 11/29/19 JM (NOTE) SARS-CoV-2 target nucleic acids are DETECTED  SARS-CoV-2 RNA is generally detectable in upper respiratory specimens  during the acute phase of infection.  Positive results are indicative  of the presence of the identified virus, but do not rule out bacterial infection or co-infection with other pathogens not detected by the test.  Clinical correlation with patient history and  other diagnostic information is necessary to determine patient infection status.  The expected result is negative.  Fact Sheet for Patients:   StrictlyIdeas.no   Fact Sheet for Healthcare Providers:   BankingDealers.co.za    This test is not yet approved or cleared by the [place] FDA and  has been authorized for detection and/or diagnosis of SARS-CoV-2 by FDA under an Emergency Use Authorization (EUA).  This EUA will remain in effect (meaning this test ca n be used) for the duration of  the  COVID-19 declaration under Section 564(b)(1) of the Act, 21 U.S.C. section 360-bbb-3(b)(1), unless the authorization is terminated or revoked sooner.  Performed at [facility], [place] [address], [place], [place] [address]      Time coordinating discharge: Over 30 minutes  SIGNED:   Little Ishikawa, DO [facility] 12/01/2019, 2:44 PM Pager   If 7PM-7AM, please contact night-coverage [email or web address]

## 2019-12-02 ENCOUNTER — Ambulatory Visit (HOSPITAL_COMMUNITY)
Admit: 2019-12-02 | Discharge: 2019-12-02 | Disposition: A | Discharge status: Home / Self Care | Payer: PPO | Source: Ambulatory Visit | Attending: Pulmonary Disease | Admitting: Pulmonary Disease

## 2019-12-02 DIAGNOSIS — U071 COVID-19: Secondary | ICD-10-CM | POA: Diagnosis not present

## 2019-12-02 DIAGNOSIS — J1289 Other viral pneumonia: Secondary | ICD-10-CM | POA: Diagnosis not present

## 2019-12-02 MED ORDER — ALBUTEROL SULFATE HFA 108 (90 BASE) MCG/ACT IN AERS: 2.0000 | INHALATION_SPRAY | Freq: Once | RESPIRATORY_TRACT | Status: DC | PRN

## 2019-12-02 MED ORDER — FAMOTIDINE IN NACL 20-0.9 MG/50ML-% IV SOLN: 20.0000 mg | Freq: Once | INTRAVENOUS | Status: DC | PRN

## 2019-12-02 MED ORDER — METHYLPREDNISOLONE SODIUM SUCC 125 MG IJ SOLR: 125.0000 mg | Freq: Once | INTRAMUSCULAR | Status: DC | PRN

## 2019-12-02 MED ORDER — SODIUM CHLORIDE 0.9 % IV SOLN: INTRAVENOUS | Status: DC | PRN

## 2019-12-02 MED ORDER — SODIUM CHLORIDE 0.9 % IV SOLN
100.0000 mg | Freq: Once | INTRAVENOUS | Status: AC
  Administered 2019-12-02: 100 mg via INTRAVENOUS
  Filled 2019-12-02: qty 20

## 2019-12-02 MED ORDER — EPINEPHRINE 0.3 MG/0.3ML IJ SOAJ: 0.3000 mg | Freq: Once | INTRAMUSCULAR | Status: DC | PRN

## 2019-12-02 MED ORDER — DIPHENHYDRAMINE HCL 50 MG/ML IJ SOLN: 50.0000 mg | Freq: Once | INTRAMUSCULAR | Status: DC | PRN

## 2019-12-02 NOTE — Discharge Instructions (Signed)
10 Things You Can Do to Manage Your COVID-19 Symptoms at Home If you have possible or confirmed COVID-19: 1. Stay home from work and school. And stay away from other public places. If you must go out, avoid using any kind of public transportation, ridesharing, or taxis. 2. Monitor your symptoms carefully. If your symptoms get worse, call your healthcare provider immediately. 3. Get rest and stay hydrated. 4. If you have a medical appointment, call the healthcare provider ahead of time and tell them that you have or may have COVID-19. 5. For medical emergencies, call 911 and notify the dispatch personnel that you have or may have COVID-19. 6. Cover your cough and sneezes with a tissue or use the inside of your elbow. 7. Wash your hands often with soap and water for at least 20 seconds or clean your hands with an alcohol-based hand sanitizer that contains at least 60% alcohol. 8. As much as possible, stay in a specific room and away from other people in your home. Also, you should use a separate bathroom, if available. If you need to be around other people in or outside of the home, wear a mask. 9. Avoid sharing personal items with other people in your household, like dishes, towels, and bedding. 10. Clean all surfaces that are touched often, like counters, tabletops, and doorknobs. Use household cleaning sprays or wipes according to the label instructions. cdc.gov/coronavirus 10/22/2018 This information is not intended to replace advice given to you by your health care provider. Make sure you discuss any questions you have with your health care provider. Document Revised: 03/26/2019 Document Reviewed: 03/26/2019 Elsevier Patient Education  2020 Elsevier Inc.  

## 2019-12-02 NOTE — Progress Notes (Signed)
  Diagnosis: COVID-19  Physician:Dr Joya Gaskins  Procedure: [facility] Med: remdesivir infusion - Provided patient with remdesivir fact sheet for patients, parents and caregivers prior to infusion.  Complications: No immediate complications noted.  Discharge: Discharged home   [place], [place] 12/02/2019

## 2019-12-03 ENCOUNTER — Ambulatory Visit (HOSPITAL_COMMUNITY)
Admit: 2019-12-03 | Discharge: 2019-12-03 | Disposition: A | Discharge status: Home / Self Care | Payer: PPO | Attending: Pulmonary Disease | Admitting: Pulmonary Disease

## 2019-12-03 DIAGNOSIS — J1282 Pneumonia due to coronavirus disease 2019: Secondary | ICD-10-CM | POA: Insufficient documentation

## 2019-12-03 DIAGNOSIS — U071 COVID-19: Secondary | ICD-10-CM | POA: Insufficient documentation

## 2019-12-03 MED ORDER — SODIUM CHLORIDE 0.9 % IV SOLN: INTRAVENOUS | Status: DC | PRN

## 2019-12-03 MED ORDER — ALBUTEROL SULFATE HFA 108 (90 BASE) MCG/ACT IN AERS: 2.0000 | INHALATION_SPRAY | Freq: Once | RESPIRATORY_TRACT | Status: DC | PRN

## 2019-12-03 MED ORDER — FAMOTIDINE IN NACL 20-0.9 MG/50ML-% IV SOLN: 20.0000 mg | Freq: Once | INTRAVENOUS | Status: DC | PRN

## 2019-12-03 MED ORDER — EPINEPHRINE 0.3 MG/0.3ML IJ SOAJ: 0.3000 mg | Freq: Once | INTRAMUSCULAR | Status: DC | PRN

## 2019-12-03 MED ORDER — DIPHENHYDRAMINE HCL 50 MG/ML IJ SOLN: 50.0000 mg | Freq: Once | INTRAMUSCULAR | Status: DC | PRN

## 2019-12-03 MED ORDER — SODIUM CHLORIDE 0.9 % IV SOLN
100.0000 mg | Freq: Once | INTRAVENOUS | Status: AC
  Administered 2019-12-03: 100 mg via INTRAVENOUS
  Filled 2019-12-03: qty 20

## 2019-12-03 MED ORDER — METHYLPREDNISOLONE SODIUM SUCC 125 MG IJ SOLR: 125.0000 mg | Freq: Once | INTRAMUSCULAR | Status: DC | PRN

## 2019-12-03 NOTE — Discharge Instructions (Signed)
10 Things You Can Do to Manage Your COVID-19 Symptoms at Home If you have possible or confirmed COVID-19: 1. Stay home from work and school. And stay away from other public places. If you must go out, avoid using any kind of public transportation, ridesharing, or taxis. 2. Monitor your symptoms carefully. If your symptoms get worse, call your healthcare provider immediately. 3. Get rest and stay hydrated. 4. If you have a medical appointment, call the healthcare provider ahead of time and tell them that you have or may have COVID-19. 5. For medical emergencies, call 911 and notify the dispatch personnel that you have or may have COVID-19. 6. Cover your cough and sneezes with a tissue or use the inside of your elbow. 7. Wash your hands often with soap and water for at least 20 seconds or clean your hands with an alcohol-based hand sanitizer that contains at least 60% alcohol. 8. As much as possible, stay in a specific room and away from other people in your home. Also, you should use a separate bathroom, if available. If you need to be around other people in or outside of the home, wear a mask. 9. Avoid sharing personal items with other people in your household, like dishes, towels, and bedding. 10. Clean all surfaces that are touched often, like counters, tabletops, and doorknobs. Use household cleaning sprays or wipes according to the label instructions. cdc.gov/coronavirus 10/22/2018 This information is not intended to replace advice given to you by your health care provider. Make sure you discuss any questions you have with your health care provider. Document Revised: 03/26/2019 Document Reviewed: 03/26/2019 Elsevier Patient Education  2020 Elsevier Inc.  

## 2019-12-03 NOTE — Progress Notes (Signed)
  Diagnosis: COVID-19  Physician:  Dr Asencion Noble   Procedure: [facility] Infusion Clinic Med: remdesivir infusion - Provided patient with remdesivir fact sheet for patients, parents and caregivers prior to infusion.  Complications: No immediate complications noted.  Discharge: Discharged home   [place] 12/03/2019

## 2019-12-11 DIAGNOSIS — U071 COVID-19: Secondary | ICD-10-CM | POA: Diagnosis not present

## 2019-12-16 DIAGNOSIS — R748 Abnormal levels of other serum enzymes: Secondary | ICD-10-CM | POA: Diagnosis not present

## 2019-12-16 DIAGNOSIS — R899 Unspecified abnormal finding in specimens from other organs, systems and tissues: Secondary | ICD-10-CM | POA: Diagnosis not present

## 2019-12-16 DIAGNOSIS — D649 Anemia, unspecified: Secondary | ICD-10-CM | POA: Diagnosis not present

## 2019-12-16 DIAGNOSIS — U071 COVID-19: Secondary | ICD-10-CM | POA: Diagnosis not present

## 2019-12-16 DIAGNOSIS — J1282 Pneumonia due to Coronavirus disease 2019: Secondary | ICD-10-CM | POA: Diagnosis not present

## 2019-12-25 DIAGNOSIS — R350 Frequency of micturition: Secondary | ICD-10-CM | POA: Diagnosis not present

## 2019-12-25 DIAGNOSIS — R351 Nocturia: Secondary | ICD-10-CM | POA: Diagnosis not present

## 2019-12-25 DIAGNOSIS — R972 Elevated prostate specific antigen [PSA]: Secondary | ICD-10-CM | POA: Diagnosis not present

## 2019-12-25 DIAGNOSIS — N401 Enlarged prostate with lower urinary tract symptoms: Secondary | ICD-10-CM | POA: Diagnosis not present

## 2020-01-01 DIAGNOSIS — U071 COVID-19: Secondary | ICD-10-CM | POA: Diagnosis not present

## 2020-01-11 DIAGNOSIS — U071 COVID-19: Secondary | ICD-10-CM | POA: Diagnosis not present

## 2020-01-13 DIAGNOSIS — Z8616 Personal history of COVID-19: Secondary | ICD-10-CM | POA: Diagnosis not present

## 2020-01-13 DIAGNOSIS — D649 Anemia, unspecified: Secondary | ICD-10-CM | POA: Diagnosis not present

## 2020-01-13 DIAGNOSIS — Z8701 Personal history of pneumonia (recurrent): Secondary | ICD-10-CM | POA: Diagnosis not present

## 2020-01-13 DIAGNOSIS — I951 Orthostatic hypotension: Secondary | ICD-10-CM | POA: Diagnosis not present

## 2020-01-20 DIAGNOSIS — D2272 Melanocytic nevi of left lower limb, including hip: Secondary | ICD-10-CM | POA: Diagnosis not present

## 2020-01-20 DIAGNOSIS — L821 Other seborrheic keratosis: Secondary | ICD-10-CM | POA: Diagnosis not present

## 2020-01-20 DIAGNOSIS — D1801 Hemangioma of skin and subcutaneous tissue: Secondary | ICD-10-CM | POA: Diagnosis not present

## 2020-01-20 DIAGNOSIS — D2261 Melanocytic nevi of right upper limb, including shoulder: Secondary | ICD-10-CM | POA: Diagnosis not present

## 2020-01-20 DIAGNOSIS — L111 Transient acantholytic dermatosis [Grover]: Secondary | ICD-10-CM | POA: Diagnosis not present

## 2020-01-20 DIAGNOSIS — B078 Other viral warts: Secondary | ICD-10-CM | POA: Diagnosis not present

## 2020-01-20 DIAGNOSIS — D225 Melanocytic nevi of trunk: Secondary | ICD-10-CM | POA: Diagnosis not present

## 2020-01-20 DIAGNOSIS — Z85820 Personal history of malignant melanoma of skin: Secondary | ICD-10-CM | POA: Diagnosis not present

## 2020-01-20 DIAGNOSIS — D2262 Melanocytic nevi of left upper limb, including shoulder: Secondary | ICD-10-CM | POA: Diagnosis not present

## 2020-01-20 DIAGNOSIS — L814 Other melanin hyperpigmentation: Secondary | ICD-10-CM | POA: Diagnosis not present

## 2020-01-20 DIAGNOSIS — D2271 Melanocytic nevi of right lower limb, including hip: Secondary | ICD-10-CM | POA: Diagnosis not present

## 2020-01-20 DIAGNOSIS — Z85828 Personal history of other malignant neoplasm of skin: Secondary | ICD-10-CM | POA: Diagnosis not present

## 2020-01-21 ENCOUNTER — Ambulatory Visit: Payer: PPO | Attending: Internal Medicine

## 2020-01-21 DIAGNOSIS — Z23 Encounter for immunization: Secondary | ICD-10-CM

## 2020-01-21 NOTE — Progress Notes (Signed)
   Covid-19 Vaccination Clinic  Name:  Alexander Ortega    MRN: [id number] DOB: 1951/04/12  01/21/2020  Mr. [name] was observed post Covid-19 immunization for 15 minutes without incident. He was provided with Vaccine Information Sheet and instruction to access the V-Safe system.   Mr. [name] was instructed to call 911 with any severe reactions post vaccine: Marland Kitchen Difficulty breathing  . Swelling of face and throat  . A fast heartbeat  . A bad rash all over body  . Dizziness and weakness   Immunizations Administered    Name Date Dose VIS Date Route   JANSSEN COVID-19 VACCINE 01/21/2020  2:21 PM 0.5 mL 06/20/2019 Intramuscular   Manufacturer: [name]   Lot: [id number]   [name]: [id number]

## 2020-01-31 DIAGNOSIS — U071 COVID-19: Secondary | ICD-10-CM | POA: Diagnosis not present

## 2020-02-10 DIAGNOSIS — U071 COVID-19: Secondary | ICD-10-CM | POA: Diagnosis not present

## 2020-03-02 DIAGNOSIS — U071 COVID-19: Secondary | ICD-10-CM | POA: Diagnosis not present

## 2020-03-03 DIAGNOSIS — R059 Cough, unspecified: Secondary | ICD-10-CM | POA: Diagnosis not present

## 2020-03-03 DIAGNOSIS — Z8616 Personal history of COVID-19: Secondary | ICD-10-CM | POA: Diagnosis not present

## 2020-04-01 DIAGNOSIS — U071 COVID-19: Secondary | ICD-10-CM | POA: Diagnosis not present

## 2020-04-08 DIAGNOSIS — Z85820 Personal history of malignant melanoma of skin: Secondary | ICD-10-CM | POA: Diagnosis not present

## 2020-04-08 DIAGNOSIS — E7800 Pure hypercholesterolemia, unspecified: Secondary | ICD-10-CM | POA: Diagnosis not present

## 2020-04-08 DIAGNOSIS — R972 Elevated prostate specific antigen [PSA]: Secondary | ICD-10-CM | POA: Diagnosis not present

## 2020-04-08 DIAGNOSIS — D649 Anemia, unspecified: Secondary | ICD-10-CM | POA: Diagnosis not present

## 2020-04-08 DIAGNOSIS — N401 Enlarged prostate with lower urinary tract symptoms: Secondary | ICD-10-CM | POA: Diagnosis not present

## 2020-04-08 DIAGNOSIS — Z79899 Other long term (current) drug therapy: Secondary | ICD-10-CM | POA: Diagnosis not present

## 2020-04-08 DIAGNOSIS — G2581 Restless legs syndrome: Secondary | ICD-10-CM | POA: Diagnosis not present

## 2020-04-08 DIAGNOSIS — Z0000 Encounter for general adult medical examination without abnormal findings: Secondary | ICD-10-CM | POA: Diagnosis not present

## 2020-04-08 DIAGNOSIS — Z86010 Personal history of colonic polyps: Secondary | ICD-10-CM | POA: Diagnosis not present

## 2020-04-11 DIAGNOSIS — U071 COVID-19: Secondary | ICD-10-CM | POA: Diagnosis not present

## 2020-04-27 DIAGNOSIS — R972 Elevated prostate specific antigen [PSA]: Secondary | ICD-10-CM | POA: Diagnosis not present

## 2020-05-02 DIAGNOSIS — U071 COVID-19: Secondary | ICD-10-CM | POA: Diagnosis not present

## 2020-05-04 DIAGNOSIS — N401 Enlarged prostate with lower urinary tract symptoms: Secondary | ICD-10-CM | POA: Diagnosis not present

## 2020-05-04 DIAGNOSIS — R350 Frequency of micturition: Secondary | ICD-10-CM | POA: Diagnosis not present

## 2020-05-04 DIAGNOSIS — R972 Elevated prostate specific antigen [PSA]: Secondary | ICD-10-CM | POA: Diagnosis not present

## 2020-05-12 DIAGNOSIS — U071 COVID-19: Secondary | ICD-10-CM | POA: Diagnosis not present

## 2020-05-12 DIAGNOSIS — H9202 Otalgia, left ear: Secondary | ICD-10-CM | POA: Diagnosis not present

## 2020-06-02 DIAGNOSIS — U071 COVID-19: Secondary | ICD-10-CM | POA: Diagnosis not present

## 2020-06-12 DIAGNOSIS — U071 COVID-19: Secondary | ICD-10-CM | POA: Diagnosis not present

## 2020-06-13 DIAGNOSIS — R972 Elevated prostate specific antigen [PSA]: Secondary | ICD-10-CM | POA: Diagnosis not present

## 2020-06-13 DIAGNOSIS — D649 Anemia, unspecified: Secondary | ICD-10-CM | POA: Diagnosis not present

## 2020-06-13 DIAGNOSIS — E7800 Pure hypercholesterolemia, unspecified: Secondary | ICD-10-CM | POA: Diagnosis not present

## 2020-06-13 DIAGNOSIS — N401 Enlarged prostate with lower urinary tract symptoms: Secondary | ICD-10-CM | POA: Diagnosis not present

## 2020-06-13 DIAGNOSIS — H6120 Impacted cerumen, unspecified ear: Secondary | ICD-10-CM | POA: Diagnosis not present

## 2020-06-13 DIAGNOSIS — Z85820 Personal history of malignant melanoma of skin: Secondary | ICD-10-CM | POA: Diagnosis not present

## 2020-06-13 DIAGNOSIS — G2581 Restless legs syndrome: Secondary | ICD-10-CM | POA: Diagnosis not present

## 2020-06-13 DIAGNOSIS — Z79899 Other long term (current) drug therapy: Secondary | ICD-10-CM | POA: Diagnosis not present

## 2020-06-13 DIAGNOSIS — Z86010 Personal history of colonic polyps: Secondary | ICD-10-CM | POA: Diagnosis not present

## 2020-06-30 DIAGNOSIS — H52223 Regular astigmatism, bilateral: Secondary | ICD-10-CM | POA: Diagnosis not present

## 2020-06-30 DIAGNOSIS — H524 Presbyopia: Secondary | ICD-10-CM | POA: Diagnosis not present

## 2020-06-30 DIAGNOSIS — H43812 Vitreous degeneration, left eye: Secondary | ICD-10-CM | POA: Diagnosis not present

## 2020-06-30 DIAGNOSIS — H5203 Hypermetropia, bilateral: Secondary | ICD-10-CM | POA: Diagnosis not present

## 2020-06-30 DIAGNOSIS — H43392 Other vitreous opacities, left eye: Secondary | ICD-10-CM | POA: Diagnosis not present

## 2020-06-30 DIAGNOSIS — U071 COVID-19: Secondary | ICD-10-CM | POA: Diagnosis not present

## 2020-07-10 DIAGNOSIS — U071 COVID-19: Secondary | ICD-10-CM | POA: Diagnosis not present

## 2020-07-19 DIAGNOSIS — L821 Other seborrheic keratosis: Secondary | ICD-10-CM | POA: Diagnosis not present

## 2020-07-19 DIAGNOSIS — L111 Transient acantholytic dermatosis [Grover]: Secondary | ICD-10-CM | POA: Diagnosis not present

## 2020-07-19 DIAGNOSIS — Z85820 Personal history of malignant melanoma of skin: Secondary | ICD-10-CM | POA: Diagnosis not present

## 2020-07-19 DIAGNOSIS — D225 Melanocytic nevi of trunk: Secondary | ICD-10-CM | POA: Diagnosis not present

## 2020-07-19 DIAGNOSIS — D2272 Melanocytic nevi of left lower limb, including hip: Secondary | ICD-10-CM | POA: Diagnosis not present

## 2020-07-19 DIAGNOSIS — D2271 Melanocytic nevi of right lower limb, including hip: Secondary | ICD-10-CM | POA: Diagnosis not present

## 2020-07-19 DIAGNOSIS — L570 Actinic keratosis: Secondary | ICD-10-CM | POA: Diagnosis not present

## 2020-07-19 DIAGNOSIS — B078 Other viral warts: Secondary | ICD-10-CM | POA: Diagnosis not present

## 2020-07-19 DIAGNOSIS — Z85828 Personal history of other malignant neoplasm of skin: Secondary | ICD-10-CM | POA: Diagnosis not present

## 2020-07-19 DIAGNOSIS — L814 Other melanin hyperpigmentation: Secondary | ICD-10-CM | POA: Diagnosis not present

## 2020-07-19 DIAGNOSIS — D1801 Hemangioma of skin and subcutaneous tissue: Secondary | ICD-10-CM | POA: Diagnosis not present

## 2020-07-31 DIAGNOSIS — U071 COVID-19: Secondary | ICD-10-CM | POA: Diagnosis not present

## 2020-08-10 DIAGNOSIS — U071 COVID-19: Secondary | ICD-10-CM | POA: Diagnosis not present

## 2020-08-21 DIAGNOSIS — J069 Acute upper respiratory infection, unspecified: Secondary | ICD-10-CM | POA: Diagnosis not present

## 2020-08-21 DIAGNOSIS — Z03818 Encounter for observation for suspected exposure to other biological agents ruled out: Secondary | ICD-10-CM | POA: Diagnosis not present

## 2020-08-30 DIAGNOSIS — U071 COVID-19: Secondary | ICD-10-CM | POA: Diagnosis not present

## 2020-09-05 DIAGNOSIS — K5900 Constipation, unspecified: Secondary | ICD-10-CM | POA: Diagnosis not present

## 2020-09-29 DIAGNOSIS — L309 Dermatitis, unspecified: Secondary | ICD-10-CM | POA: Diagnosis not present

## 2020-09-29 DIAGNOSIS — D649 Anemia, unspecified: Secondary | ICD-10-CM | POA: Diagnosis not present

## 2020-09-29 DIAGNOSIS — M25561 Pain in right knee: Secondary | ICD-10-CM | POA: Diagnosis not present

## 2020-09-29 DIAGNOSIS — R42 Dizziness and giddiness: Secondary | ICD-10-CM | POA: Diagnosis not present

## 2020-10-04 DIAGNOSIS — L820 Inflamed seborrheic keratosis: Secondary | ICD-10-CM | POA: Diagnosis not present

## 2020-10-04 DIAGNOSIS — L905 Scar conditions and fibrosis of skin: Secondary | ICD-10-CM | POA: Diagnosis not present

## 2020-10-04 DIAGNOSIS — L821 Other seborrheic keratosis: Secondary | ICD-10-CM | POA: Diagnosis not present

## 2020-10-04 DIAGNOSIS — Z85828 Personal history of other malignant neoplasm of skin: Secondary | ICD-10-CM | POA: Diagnosis not present

## 2020-10-04 DIAGNOSIS — Z85820 Personal history of malignant melanoma of skin: Secondary | ICD-10-CM | POA: Diagnosis not present

## 2020-10-04 DIAGNOSIS — L720 Epidermal cyst: Secondary | ICD-10-CM | POA: Diagnosis not present

## 2020-10-19 DIAGNOSIS — M79644 Pain in right finger(s): Secondary | ICD-10-CM | POA: Diagnosis not present

## 2020-10-19 DIAGNOSIS — M25561 Pain in right knee: Secondary | ICD-10-CM | POA: Diagnosis not present

## 2020-10-22 DIAGNOSIS — M25561 Pain in right knee: Secondary | ICD-10-CM | POA: Diagnosis not present

## 2020-10-26 DIAGNOSIS — R972 Elevated prostate specific antigen [PSA]: Secondary | ICD-10-CM | POA: Diagnosis not present

## 2020-11-02 DIAGNOSIS — N401 Enlarged prostate with lower urinary tract symptoms: Secondary | ICD-10-CM | POA: Diagnosis not present

## 2020-11-02 DIAGNOSIS — R351 Nocturia: Secondary | ICD-10-CM | POA: Diagnosis not present

## 2020-11-02 DIAGNOSIS — R972 Elevated prostate specific antigen [PSA]: Secondary | ICD-10-CM | POA: Diagnosis not present

## 2020-11-02 DIAGNOSIS — M25561 Pain in right knee: Secondary | ICD-10-CM | POA: Diagnosis not present

## 2020-12-02 DIAGNOSIS — M25562 Pain in left knee: Secondary | ICD-10-CM | POA: Diagnosis not present

## 2020-12-16 DIAGNOSIS — D485 Neoplasm of uncertain behavior of skin: Secondary | ICD-10-CM | POA: Diagnosis not present

## 2020-12-16 DIAGNOSIS — Z85828 Personal history of other malignant neoplasm of skin: Secondary | ICD-10-CM | POA: Diagnosis not present

## 2020-12-16 DIAGNOSIS — Z85820 Personal history of malignant melanoma of skin: Secondary | ICD-10-CM | POA: Diagnosis not present

## 2020-12-16 DIAGNOSIS — L820 Inflamed seborrheic keratosis: Secondary | ICD-10-CM | POA: Diagnosis not present

## 2020-12-16 DIAGNOSIS — B079 Viral wart, unspecified: Secondary | ICD-10-CM | POA: Diagnosis not present

## 2020-12-16 DIAGNOSIS — L298 Other pruritus: Secondary | ICD-10-CM | POA: Diagnosis not present

## 2021-01-04 DIAGNOSIS — D122 Benign neoplasm of ascending colon: Secondary | ICD-10-CM | POA: Diagnosis not present

## 2021-01-04 DIAGNOSIS — Z86010 Personal history of colonic polyps: Secondary | ICD-10-CM | POA: Diagnosis not present

## 2021-01-05 DIAGNOSIS — Z85828 Personal history of other malignant neoplasm of skin: Secondary | ICD-10-CM | POA: Diagnosis not present

## 2021-01-05 DIAGNOSIS — Z85820 Personal history of malignant melanoma of skin: Secondary | ICD-10-CM | POA: Diagnosis not present

## 2021-01-05 DIAGNOSIS — L237 Allergic contact dermatitis due to plants, except food: Secondary | ICD-10-CM | POA: Diagnosis not present

## 2021-01-06 DIAGNOSIS — D122 Benign neoplasm of ascending colon: Secondary | ICD-10-CM | POA: Diagnosis not present

## 2021-03-07 ENCOUNTER — Ambulatory Visit: Payer: PPO | Admitting: Sports Medicine

## 2021-03-07 ENCOUNTER — Encounter: Payer: Self-pay | Admitting: Sports Medicine

## 2021-03-07 VITALS — BP 110/78 | Ht 70.5 in | Wt 174.0 lb

## 2021-03-07 DIAGNOSIS — M25561 Pain in right knee: Secondary | ICD-10-CM

## 2021-03-07 NOTE — Progress Notes (Signed)
   Subjective:    Patient ID: Alexander Ortega, male    DOB: [birth year], 70 y.o.   MRN: [id number]  HPI chief complaint: Right knee pain  Alexander Ortega is a very pleasant 70 year old male that presents today with right knee pain.  He states that 18 months ago he fell injuring his left knee.  He saw Dr. Fredonia Highland and an MRI of his left knee at that time showed no significant damage per the patient's report.  He was provided with a knee brace and his symptoms improved.  Approximately 6 months ago he began to develop pain primarily along the medial right knee that began without any known trauma.  Only increase in activity was a lot of driving at the time.  He describes popping and snapping intermittently associated with sharp stabbing pain.  His symptoms are often times fleeting.  He followed up with Dr. Percell Miller again and an MRI of his right knee was ordered.  MRI is available for review.  He has had 2 cortisone injections and been fitted with a couple of different knee braces.  Overall, he feels like his pain may be improving.  He does endorse some mild swelling.  He also takes glucosamine which he finds helpful.  He has not had any physical therapy.  No prior knee surgeries.  He is here today with his wife.  Past medical history reviewed Medications reviewed Allergies reviewed    Review of Systems As above    Objective:   Physical Exam  Well-developed, well-nourished.  No acute distress  Right knee: Full range of motion.  No obvious effusion.  There is some slight tenderness to palpation along the medial joint line but a negative McMurray's.  No pain with Thessaly's testing.  Knee is stable to valgus and varus stressing.  Negative anterior drawer, negative posterior drawer.  Trace patellofemoral crepitus.  Mild varus deformity.  Neurovascularly intact distally.  X-rays of his right knee including AP, lateral, and sunrise views show only minimal degenerative changes.  An MRI of the right knee dated October 22, 2020 shows high-grade partial-thickness cartilage loss and areas of full-thickness cartilage loss in the medial compartment.  He also has a very small radial tear of the free edge of the body of the medial meniscus.      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  I have recommended that we continue to give this a little more time.  I do think he should continue with his knee brace when exercising.  He enjoys working out on the recumbent bike and I have reassured him that I think he is okay to resume this using pain as his guide.  He will continue with his glucosamine and we will give him a home exercise program.  Of note, he has had several pairs of orthotics made in the past.  They have helped him but they are quite old and worn out.  He will return to the office in a couple of weeks for new custom orthotics.  I do think they may help his knee pain.  He will call with questions or concerns in the interim.  This note was dictated using Dragon naturally speaking software and may contain errors in syntax, spelling, or content which have not been identified prior to signing this note.

## 2021-03-21 ENCOUNTER — Encounter: Payer: PPO | Admitting: Sports Medicine

## 2021-03-24 ENCOUNTER — Ambulatory Visit: Payer: PPO | Admitting: Sports Medicine

## 2021-03-24 VITALS — BP 122/74 | Ht 70.5 in | Wt 176.0 lb

## 2021-03-24 DIAGNOSIS — R269 Unspecified abnormalities of gait and mobility: Secondary | ICD-10-CM

## 2021-03-24 DIAGNOSIS — M1711 Unilateral primary osteoarthritis, right knee: Secondary | ICD-10-CM

## 2021-03-24 DIAGNOSIS — M25561 Pain in right knee: Secondary | ICD-10-CM

## 2021-03-24 NOTE — Progress Notes (Signed)
Patient ID: Eligha Kmetz, male   DOB: 25-Mar-1951, 70 y.o.   MRN: [id number]  Yordan presents today for new custom orthotics.  Please see the office note from November 15 for details regarding history and physical exam findings.  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  His current custom orthotics are quite old and worn out.  We had discussed the possibility of new orthotics at his last office visit.  He would like to proceed with those today.  Patient was fitted for a : standard, cushioned, semi-rigid orthotic. The orthotic was heated and afterward the patient stood on the orthotic blank positioned on the orthotic stand. The patient was positioned in subtalar neutral position and 10 degrees of ankle dorsiflexion in a weight bearing stance. After completion of molding, a stable base was applied to the orthotic blank. The blank was ground to a stable position for weight bearing. Size: 12 Base: Blue EVA Posting: 5th ray post on the left Additional orthotic padding: B/L MT cookies  Lexx found the orthotics to be comfortable prior to leaving the office.  Gait was neutral with orthotics in place.  He will continue with his quad strengthening exercises for his knee osteoarthritis and he will continue to increase the amount of recreational walking and recumbent biking as his symptoms allow.  Future treatment considerations for his knee osteoarthritis could include viscosupplementation or formal physical therapy.  He will follow-up as needed.  This note was dictated using Dragon naturally speaking software and may contain errors in syntax, spelling, or content which have not been identified prior to signing this note.

## 2021-03-28 DIAGNOSIS — D2272 Melanocytic nevi of left lower limb, including hip: Secondary | ICD-10-CM | POA: Diagnosis not present

## 2021-03-28 DIAGNOSIS — L111 Transient acantholytic dermatosis [Grover]: Secondary | ICD-10-CM | POA: Diagnosis not present

## 2021-03-28 DIAGNOSIS — D2271 Melanocytic nevi of right lower limb, including hip: Secondary | ICD-10-CM | POA: Diagnosis not present

## 2021-03-28 DIAGNOSIS — D1801 Hemangioma of skin and subcutaneous tissue: Secondary | ICD-10-CM | POA: Diagnosis not present

## 2021-03-28 DIAGNOSIS — B078 Other viral warts: Secondary | ICD-10-CM | POA: Diagnosis not present

## 2021-03-28 DIAGNOSIS — L821 Other seborrheic keratosis: Secondary | ICD-10-CM | POA: Diagnosis not present

## 2021-03-28 DIAGNOSIS — Z85828 Personal history of other malignant neoplasm of skin: Secondary | ICD-10-CM | POA: Diagnosis not present

## 2021-03-28 DIAGNOSIS — D225 Melanocytic nevi of trunk: Secondary | ICD-10-CM | POA: Diagnosis not present

## 2021-03-28 DIAGNOSIS — L570 Actinic keratosis: Secondary | ICD-10-CM | POA: Diagnosis not present

## 2021-03-28 DIAGNOSIS — Z85820 Personal history of malignant melanoma of skin: Secondary | ICD-10-CM | POA: Diagnosis not present

## 2021-03-28 DIAGNOSIS — L814 Other melanin hyperpigmentation: Secondary | ICD-10-CM | POA: Diagnosis not present

## 2021-03-28 DIAGNOSIS — I788 Other diseases of capillaries: Secondary | ICD-10-CM | POA: Diagnosis not present

## 2021-05-22 DIAGNOSIS — Z0000 Encounter for general adult medical examination without abnormal findings: Secondary | ICD-10-CM | POA: Diagnosis not present

## 2021-05-22 DIAGNOSIS — E7800 Pure hypercholesterolemia, unspecified: Secondary | ICD-10-CM | POA: Diagnosis not present

## 2021-05-22 DIAGNOSIS — D649 Anemia, unspecified: Secondary | ICD-10-CM | POA: Diagnosis not present

## 2021-05-22 DIAGNOSIS — R972 Elevated prostate specific antigen [PSA]: Secondary | ICD-10-CM | POA: Diagnosis not present

## 2021-05-24 DIAGNOSIS — R972 Elevated prostate specific antigen [PSA]: Secondary | ICD-10-CM | POA: Diagnosis not present

## 2021-05-26 DIAGNOSIS — G2581 Restless legs syndrome: Secondary | ICD-10-CM | POA: Diagnosis not present

## 2021-05-26 DIAGNOSIS — Z0000 Encounter for general adult medical examination without abnormal findings: Secondary | ICD-10-CM | POA: Diagnosis not present

## 2021-05-26 DIAGNOSIS — Z85820 Personal history of malignant melanoma of skin: Secondary | ICD-10-CM | POA: Diagnosis not present

## 2021-05-26 DIAGNOSIS — R972 Elevated prostate specific antigen [PSA]: Secondary | ICD-10-CM | POA: Diagnosis not present

## 2021-05-26 DIAGNOSIS — E7800 Pure hypercholesterolemia, unspecified: Secondary | ICD-10-CM | POA: Diagnosis not present

## 2021-06-22 DIAGNOSIS — H43812 Vitreous degeneration, left eye: Secondary | ICD-10-CM | POA: Diagnosis not present

## 2021-06-22 DIAGNOSIS — H524 Presbyopia: Secondary | ICD-10-CM | POA: Diagnosis not present

## 2021-06-22 DIAGNOSIS — H43392 Other vitreous opacities, left eye: Secondary | ICD-10-CM | POA: Diagnosis not present

## 2021-06-22 DIAGNOSIS — H52223 Regular astigmatism, bilateral: Secondary | ICD-10-CM | POA: Diagnosis not present

## 2021-06-22 DIAGNOSIS — H5203 Hypermetropia, bilateral: Secondary | ICD-10-CM | POA: Diagnosis not present

## 2021-07-03 DIAGNOSIS — R351 Nocturia: Secondary | ICD-10-CM | POA: Diagnosis not present

## 2021-07-03 DIAGNOSIS — N401 Enlarged prostate with lower urinary tract symptoms: Secondary | ICD-10-CM | POA: Diagnosis not present

## 2021-07-03 DIAGNOSIS — R972 Elevated prostate specific antigen [PSA]: Secondary | ICD-10-CM | POA: Diagnosis not present

## 2021-08-24 DIAGNOSIS — H25012 Cortical age-related cataract, left eye: Secondary | ICD-10-CM | POA: Diagnosis not present

## 2021-08-24 DIAGNOSIS — H524 Presbyopia: Secondary | ICD-10-CM | POA: Diagnosis not present

## 2021-08-24 DIAGNOSIS — H2513 Age-related nuclear cataract, bilateral: Secondary | ICD-10-CM | POA: Diagnosis not present

## 2021-08-24 DIAGNOSIS — H5203 Hypermetropia, bilateral: Secondary | ICD-10-CM | POA: Diagnosis not present

## 2021-08-24 DIAGNOSIS — H2512 Age-related nuclear cataract, left eye: Secondary | ICD-10-CM | POA: Diagnosis not present

## 2021-08-24 DIAGNOSIS — H18513 Endothelial corneal dystrophy, bilateral: Secondary | ICD-10-CM | POA: Diagnosis not present

## 2021-08-24 DIAGNOSIS — H25032 Anterior subcapsular polar age-related cataract, left eye: Secondary | ICD-10-CM | POA: Diagnosis not present

## 2021-08-24 DIAGNOSIS — H52223 Regular astigmatism, bilateral: Secondary | ICD-10-CM | POA: Diagnosis not present

## 2021-09-19 DIAGNOSIS — G8929 Other chronic pain: Secondary | ICD-10-CM | POA: Diagnosis not present

## 2021-09-19 DIAGNOSIS — M25561 Pain in right knee: Secondary | ICD-10-CM | POA: Diagnosis not present

## 2021-09-19 DIAGNOSIS — M2351 Chronic instability of knee, right knee: Secondary | ICD-10-CM | POA: Diagnosis not present

## 2021-10-25 DIAGNOSIS — L905 Scar conditions and fibrosis of skin: Secondary | ICD-10-CM | POA: Diagnosis not present

## 2021-10-25 DIAGNOSIS — Z85820 Personal history of malignant melanoma of skin: Secondary | ICD-10-CM | POA: Diagnosis not present

## 2021-10-25 DIAGNOSIS — L111 Transient acantholytic dermatosis [Grover]: Secondary | ICD-10-CM | POA: Diagnosis not present

## 2021-10-25 DIAGNOSIS — Z85828 Personal history of other malignant neoplasm of skin: Secondary | ICD-10-CM | POA: Diagnosis not present

## 2021-10-25 DIAGNOSIS — D225 Melanocytic nevi of trunk: Secondary | ICD-10-CM | POA: Diagnosis not present

## 2021-10-25 DIAGNOSIS — L570 Actinic keratosis: Secondary | ICD-10-CM | POA: Diagnosis not present

## 2021-10-25 DIAGNOSIS — L821 Other seborrheic keratosis: Secondary | ICD-10-CM | POA: Diagnosis not present

## 2021-10-25 DIAGNOSIS — L720 Epidermal cyst: Secondary | ICD-10-CM | POA: Diagnosis not present

## 2021-10-25 DIAGNOSIS — L738 Other specified follicular disorders: Secondary | ICD-10-CM | POA: Diagnosis not present

## 2021-10-25 DIAGNOSIS — D1801 Hemangioma of skin and subcutaneous tissue: Secondary | ICD-10-CM | POA: Diagnosis not present

## 2022-01-10 DIAGNOSIS — R972 Elevated prostate specific antigen [PSA]: Secondary | ICD-10-CM | POA: Diagnosis not present

## 2022-01-17 DIAGNOSIS — R351 Nocturia: Secondary | ICD-10-CM | POA: Diagnosis not present

## 2022-01-17 DIAGNOSIS — N401 Enlarged prostate with lower urinary tract symptoms: Secondary | ICD-10-CM | POA: Diagnosis not present

## 2022-01-17 DIAGNOSIS — R350 Frequency of micturition: Secondary | ICD-10-CM | POA: Diagnosis not present

## 2022-03-12 DIAGNOSIS — Z5181 Encounter for therapeutic drug level monitoring: Secondary | ICD-10-CM | POA: Diagnosis not present

## 2022-03-12 DIAGNOSIS — D649 Anemia, unspecified: Secondary | ICD-10-CM | POA: Diagnosis not present

## 2022-03-12 DIAGNOSIS — M542 Cervicalgia: Secondary | ICD-10-CM | POA: Diagnosis not present

## 2022-03-12 DIAGNOSIS — M79639 Pain in unspecified forearm: Secondary | ICD-10-CM | POA: Diagnosis not present

## 2022-03-12 DIAGNOSIS — G2581 Restless legs syndrome: Secondary | ICD-10-CM | POA: Diagnosis not present

## 2022-04-03 DIAGNOSIS — H52223 Regular astigmatism, bilateral: Secondary | ICD-10-CM | POA: Diagnosis not present

## 2022-04-03 DIAGNOSIS — H43813 Vitreous degeneration, bilateral: Secondary | ICD-10-CM | POA: Diagnosis not present

## 2022-04-03 DIAGNOSIS — H43393 Other vitreous opacities, bilateral: Secondary | ICD-10-CM | POA: Diagnosis not present

## 2022-04-03 DIAGNOSIS — M79632 Pain in left forearm: Secondary | ICD-10-CM | POA: Diagnosis not present

## 2022-04-03 DIAGNOSIS — H524 Presbyopia: Secondary | ICD-10-CM | POA: Diagnosis not present

## 2022-04-03 DIAGNOSIS — H5203 Hypermetropia, bilateral: Secondary | ICD-10-CM | POA: Diagnosis not present

## 2022-04-09 DIAGNOSIS — L821 Other seborrheic keratosis: Secondary | ICD-10-CM | POA: Diagnosis not present

## 2022-04-09 DIAGNOSIS — L720 Epidermal cyst: Secondary | ICD-10-CM | POA: Diagnosis not present

## 2022-04-09 DIAGNOSIS — D225 Melanocytic nevi of trunk: Secondary | ICD-10-CM | POA: Diagnosis not present

## 2022-04-09 DIAGNOSIS — L570 Actinic keratosis: Secondary | ICD-10-CM | POA: Diagnosis not present

## 2022-04-09 DIAGNOSIS — D1801 Hemangioma of skin and subcutaneous tissue: Secondary | ICD-10-CM | POA: Diagnosis not present

## 2022-04-09 DIAGNOSIS — Z85820 Personal history of malignant melanoma of skin: Secondary | ICD-10-CM | POA: Diagnosis not present

## 2022-04-09 DIAGNOSIS — Z85828 Personal history of other malignant neoplasm of skin: Secondary | ICD-10-CM | POA: Diagnosis not present

## 2022-04-09 DIAGNOSIS — D485 Neoplasm of uncertain behavior of skin: Secondary | ICD-10-CM | POA: Diagnosis not present

## 2022-04-09 DIAGNOSIS — L814 Other melanin hyperpigmentation: Secondary | ICD-10-CM | POA: Diagnosis not present

## 2022-04-09 DIAGNOSIS — L111 Transient acantholytic dermatosis [Grover]: Secondary | ICD-10-CM | POA: Diagnosis not present

## 2022-04-24 DIAGNOSIS — H04123 Dry eye syndrome of bilateral lacrimal glands: Secondary | ICD-10-CM | POA: Diagnosis not present

## 2022-04-24 DIAGNOSIS — H18513 Endothelial corneal dystrophy, bilateral: Secondary | ICD-10-CM | POA: Diagnosis not present

## 2022-04-24 DIAGNOSIS — H33311 Horseshoe tear of retina without detachment, right eye: Secondary | ICD-10-CM | POA: Diagnosis not present

## 2022-04-24 DIAGNOSIS — H2513 Age-related nuclear cataract, bilateral: Secondary | ICD-10-CM | POA: Diagnosis not present

## 2022-04-25 DIAGNOSIS — H33311 Horseshoe tear of retina without detachment, right eye: Secondary | ICD-10-CM | POA: Diagnosis not present

## 2022-04-25 DIAGNOSIS — H35371 Puckering of macula, right eye: Secondary | ICD-10-CM | POA: Diagnosis not present

## 2022-04-25 DIAGNOSIS — H43813 Vitreous degeneration, bilateral: Secondary | ICD-10-CM | POA: Diagnosis not present

## 2022-04-25 DIAGNOSIS — H4311 Vitreous hemorrhage, right eye: Secondary | ICD-10-CM | POA: Diagnosis not present

## 2022-04-25 DIAGNOSIS — D649 Anemia, unspecified: Secondary | ICD-10-CM | POA: Diagnosis not present

## 2022-05-09 DIAGNOSIS — H59811 Chorioretinal scars after surgery for detachment, right eye: Secondary | ICD-10-CM | POA: Diagnosis not present

## 2022-05-09 DIAGNOSIS — H43813 Vitreous degeneration, bilateral: Secondary | ICD-10-CM | POA: Diagnosis not present

## 2022-05-09 DIAGNOSIS — H35371 Puckering of macula, right eye: Secondary | ICD-10-CM | POA: Diagnosis not present

## 2022-05-09 DIAGNOSIS — H33311 Horseshoe tear of retina without detachment, right eye: Secondary | ICD-10-CM | POA: Diagnosis not present

## 2022-05-28 DIAGNOSIS — M79632 Pain in left forearm: Secondary | ICD-10-CM | POA: Diagnosis not present

## 2022-05-29 ENCOUNTER — Other Ambulatory Visit: Payer: Self-pay | Admitting: Sports Medicine

## 2022-05-29 DIAGNOSIS — M79632 Pain in left forearm: Secondary | ICD-10-CM

## 2022-06-11 DIAGNOSIS — Z0000 Encounter for general adult medical examination without abnormal findings: Secondary | ICD-10-CM | POA: Diagnosis not present

## 2022-06-11 DIAGNOSIS — E7800 Pure hypercholesterolemia, unspecified: Secondary | ICD-10-CM | POA: Diagnosis not present

## 2022-06-11 DIAGNOSIS — D649 Anemia, unspecified: Secondary | ICD-10-CM | POA: Diagnosis not present

## 2022-06-11 DIAGNOSIS — R972 Elevated prostate specific antigen [PSA]: Secondary | ICD-10-CM | POA: Diagnosis not present

## 2022-06-13 DIAGNOSIS — N401 Enlarged prostate with lower urinary tract symptoms: Secondary | ICD-10-CM | POA: Diagnosis not present

## 2022-06-13 DIAGNOSIS — G2581 Restless legs syndrome: Secondary | ICD-10-CM | POA: Diagnosis not present

## 2022-06-13 DIAGNOSIS — R972 Elevated prostate specific antigen [PSA]: Secondary | ICD-10-CM | POA: Diagnosis not present

## 2022-06-13 DIAGNOSIS — E7800 Pure hypercholesterolemia, unspecified: Secondary | ICD-10-CM | POA: Diagnosis not present

## 2022-06-13 DIAGNOSIS — K5900 Constipation, unspecified: Secondary | ICD-10-CM | POA: Diagnosis not present

## 2022-06-13 DIAGNOSIS — Z0000 Encounter for general adult medical examination without abnormal findings: Secondary | ICD-10-CM | POA: Diagnosis not present

## 2022-06-13 DIAGNOSIS — Z85820 Personal history of malignant melanoma of skin: Secondary | ICD-10-CM | POA: Diagnosis not present

## 2022-06-21 ENCOUNTER — Ambulatory Visit
Admission: RE | Admit: 2022-06-21 | Discharge: 2022-06-21 | Disposition: A | Discharge status: Home / Self Care | Payer: PPO | Source: Ambulatory Visit | Attending: Sports Medicine | Admitting: Sports Medicine

## 2022-06-21 DIAGNOSIS — M79632 Pain in left forearm: Secondary | ICD-10-CM

## 2022-06-21 MED ORDER — GADOPICLENOL 0.5 MMOL/ML IV SOLN
8.0000 mL | Freq: Once | INTRAVENOUS | Status: AC | PRN
  Administered 2022-06-21: 8 mL via INTRAVENOUS

## 2022-06-26 IMAGING — DX DG CHEST 1V PORT
2 series · 2 of 2 positions shown · non-contrast
Comparison: None.

CLINICAL DATA: Oovid-0R symptoms started on [DATE]. Fever, insomnia.
Rash. Symptoms for 1 week.

EXAM:
PORTABLE CHEST 1 VIEW

[chest ap (1 of 2)]
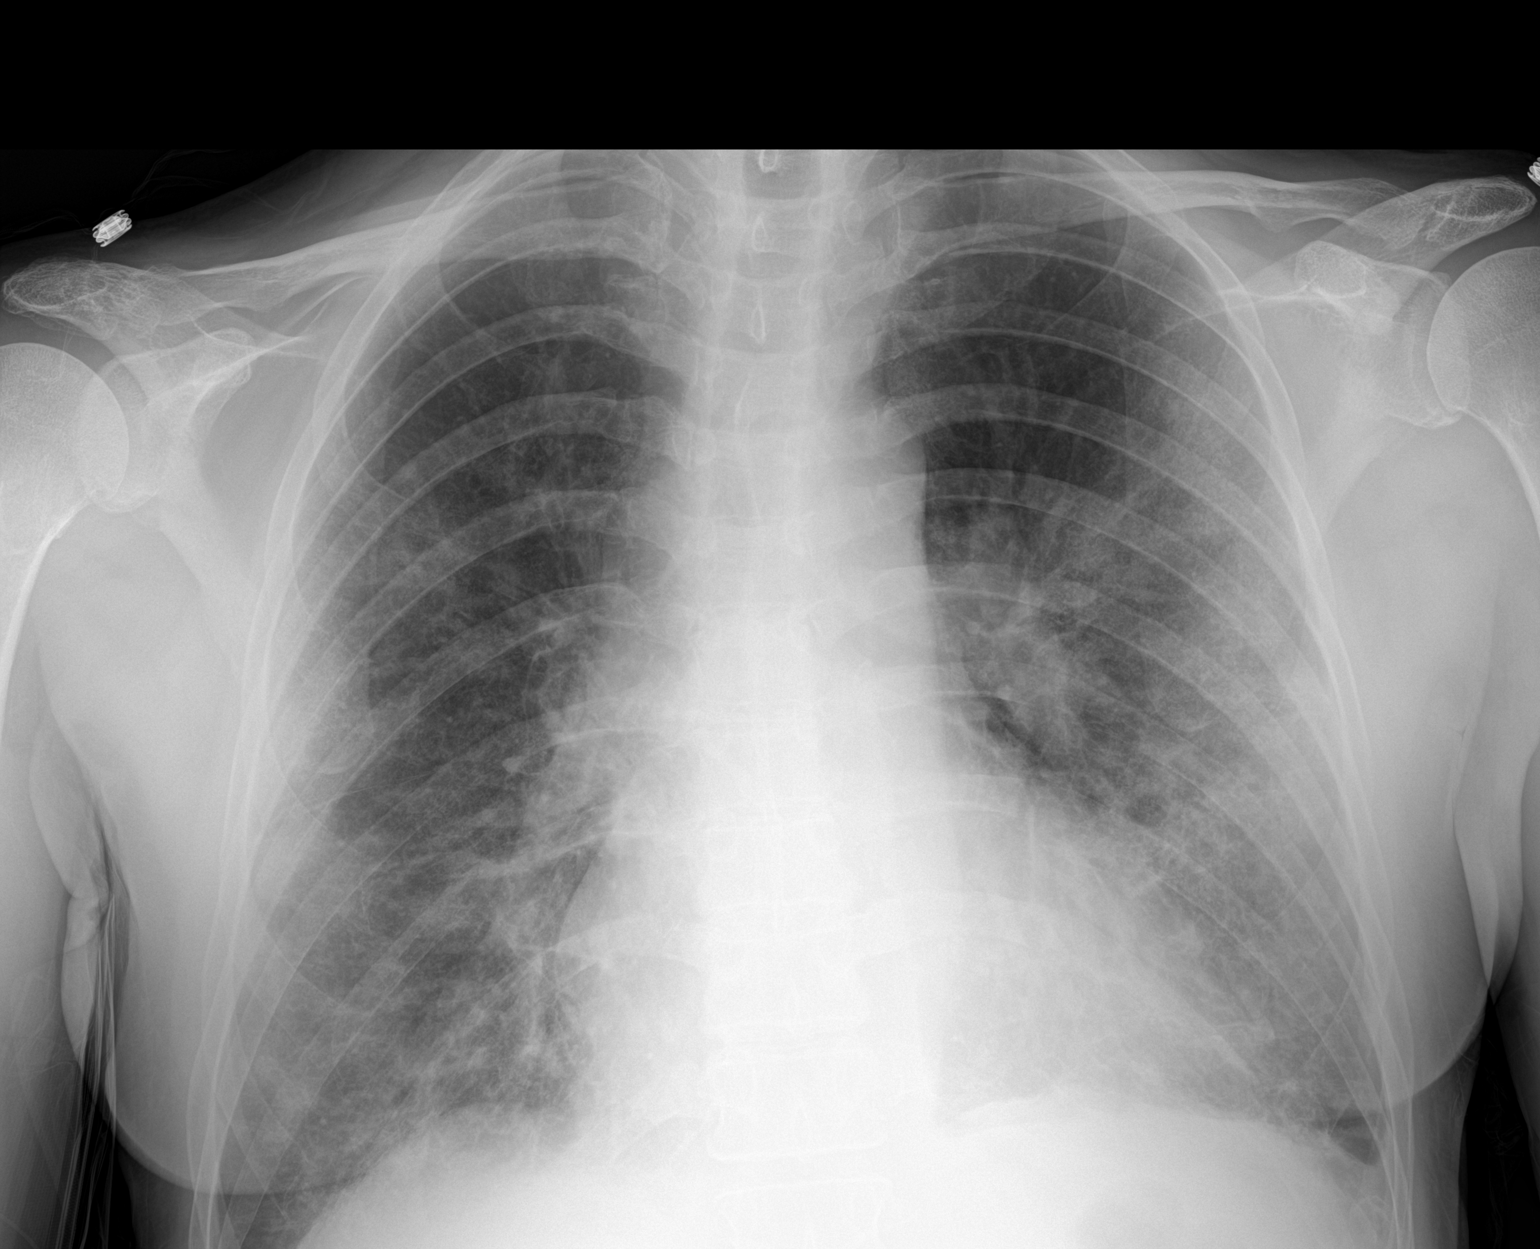

[chest ap (2 of 2)]
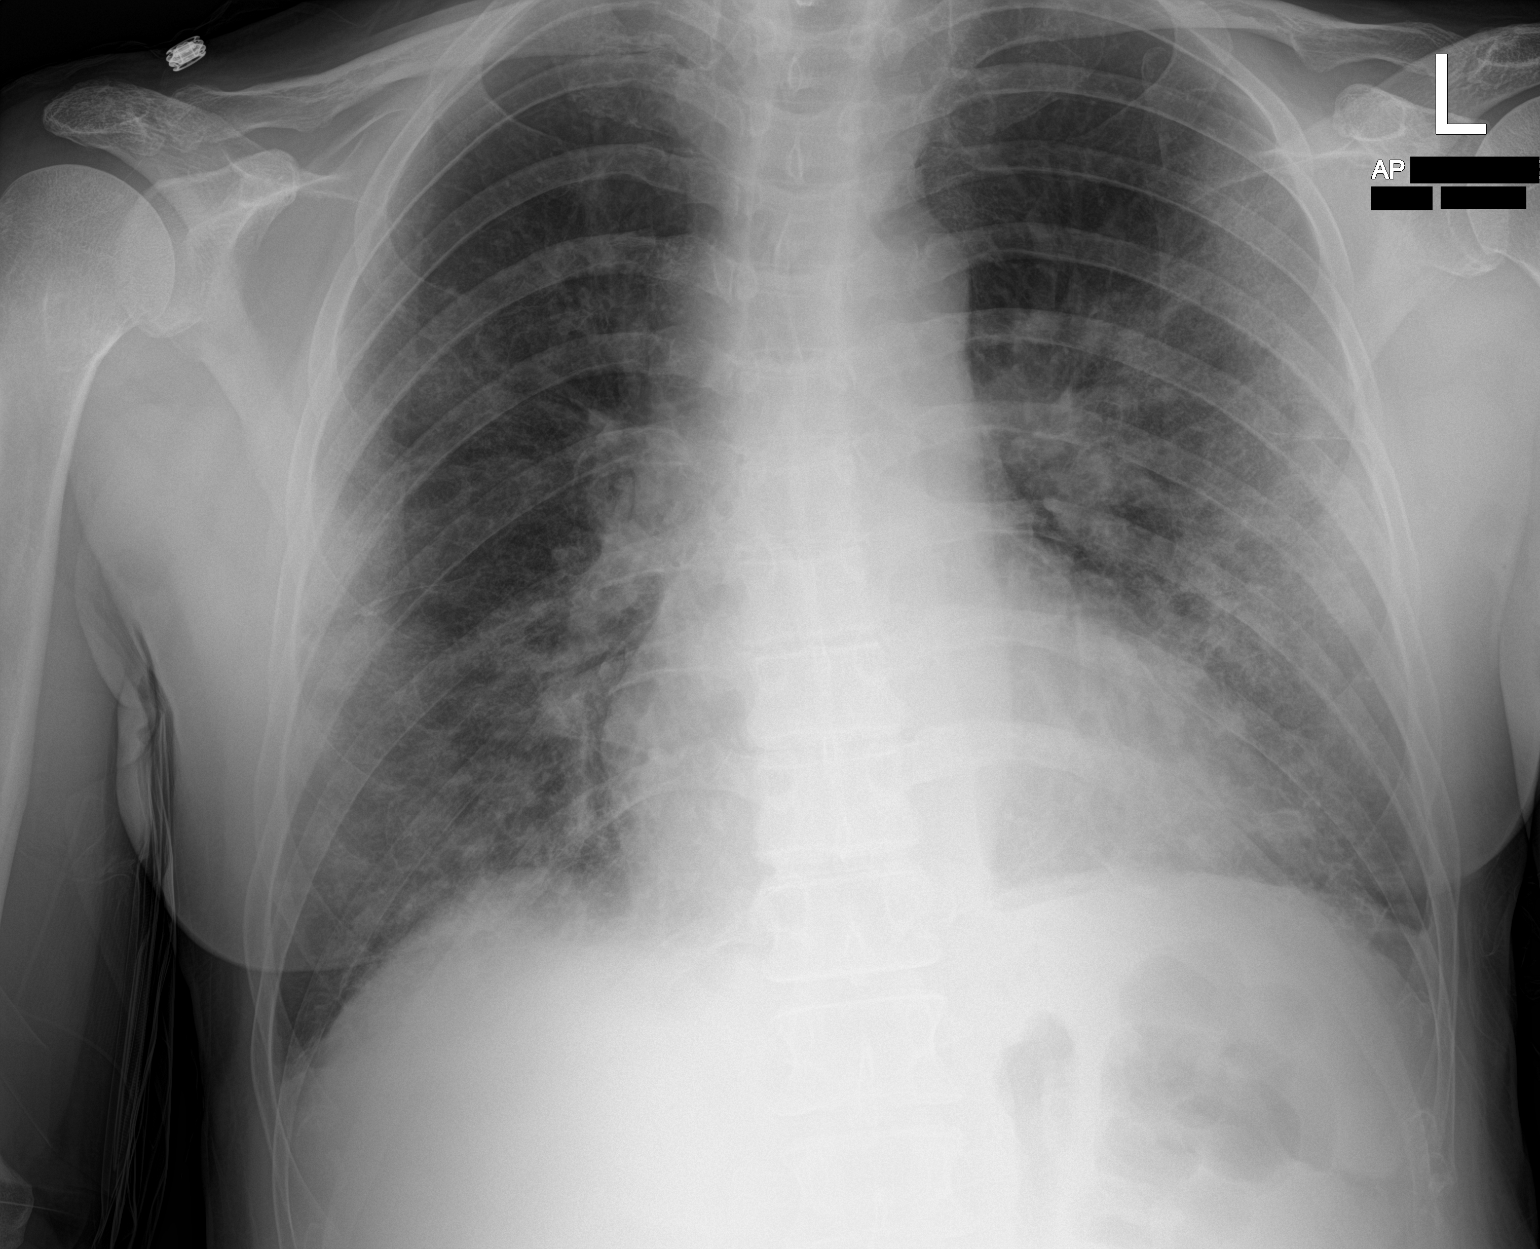

[2 of 2 positions shown; findings below may reference images not displayed]

FINDINGS: Heart size is accentuated by technique and probably UPPER limits
normal. There patchy airspace filling opacities in the lungs
bilaterally, relatively sparing the apices and involving the LEFT
lung greater than the RIGHT. No pleural effusions.
IMPRESSION: Bilateral infiltrates.

## 2022-07-18 DIAGNOSIS — H43391 Other vitreous opacities, right eye: Secondary | ICD-10-CM | POA: Diagnosis not present

## 2022-07-18 DIAGNOSIS — H2511 Age-related nuclear cataract, right eye: Secondary | ICD-10-CM | POA: Diagnosis not present

## 2022-07-18 DIAGNOSIS — H43811 Vitreous degeneration, right eye: Secondary | ICD-10-CM | POA: Diagnosis not present

## 2022-07-18 DIAGNOSIS — H35371 Puckering of macula, right eye: Secondary | ICD-10-CM | POA: Diagnosis not present

## 2022-07-18 DIAGNOSIS — H31091 Other chorioretinal scars, right eye: Secondary | ICD-10-CM | POA: Diagnosis not present

## 2022-07-27 DIAGNOSIS — L821 Other seborrheic keratosis: Secondary | ICD-10-CM | POA: Diagnosis not present

## 2022-07-27 DIAGNOSIS — D485 Neoplasm of uncertain behavior of skin: Secondary | ICD-10-CM | POA: Diagnosis not present

## 2022-07-27 DIAGNOSIS — Z85828 Personal history of other malignant neoplasm of skin: Secondary | ICD-10-CM | POA: Diagnosis not present

## 2022-07-27 DIAGNOSIS — Z85820 Personal history of malignant melanoma of skin: Secondary | ICD-10-CM | POA: Diagnosis not present

## 2022-07-27 DIAGNOSIS — D225 Melanocytic nevi of trunk: Secondary | ICD-10-CM | POA: Diagnosis not present

## 2022-07-27 DIAGNOSIS — L820 Inflamed seborrheic keratosis: Secondary | ICD-10-CM | POA: Diagnosis not present

## 2022-07-27 DIAGNOSIS — L720 Epidermal cyst: Secondary | ICD-10-CM | POA: Diagnosis not present

## 2022-08-15 DIAGNOSIS — R972 Elevated prostate specific antigen [PSA]: Secondary | ICD-10-CM | POA: Diagnosis not present

## 2022-08-22 ENCOUNTER — Other Ambulatory Visit: Payer: Self-pay | Admitting: Urology

## 2022-08-22 DIAGNOSIS — N401 Enlarged prostate with lower urinary tract symptoms: Secondary | ICD-10-CM | POA: Diagnosis not present

## 2022-08-22 DIAGNOSIS — R351 Nocturia: Secondary | ICD-10-CM | POA: Diagnosis not present

## 2022-08-22 DIAGNOSIS — R972 Elevated prostate specific antigen [PSA]: Secondary | ICD-10-CM

## 2022-10-03 ENCOUNTER — Ambulatory Visit
Admission: RE | Admit: 2022-10-03 | Discharge: 2022-10-03 | Disposition: A | Discharge status: Home / Self Care | Payer: PPO | Source: Ambulatory Visit | Attending: Urology | Admitting: Urology

## 2022-10-03 DIAGNOSIS — R972 Elevated prostate specific antigen [PSA]: Secondary | ICD-10-CM

## 2022-10-03 MED ORDER — GADOPICLENOL 0.5 MMOL/ML IV SOLN
7.5000 mL | Freq: Once | INTRAVENOUS | Status: AC | PRN
  Administered 2022-10-03: 7.5 mL via INTRAVENOUS

## 2023-01-09 DIAGNOSIS — R351 Nocturia: Secondary | ICD-10-CM | POA: Diagnosis not present

## 2023-01-09 DIAGNOSIS — N401 Enlarged prostate with lower urinary tract symptoms: Secondary | ICD-10-CM | POA: Diagnosis not present

## 2023-01-09 DIAGNOSIS — R972 Elevated prostate specific antigen [PSA]: Secondary | ICD-10-CM | POA: Diagnosis not present

## 2023-02-28 DIAGNOSIS — D225 Melanocytic nevi of trunk: Secondary | ICD-10-CM | POA: Diagnosis not present

## 2023-02-28 DIAGNOSIS — L570 Actinic keratosis: Secondary | ICD-10-CM | POA: Diagnosis not present

## 2023-02-28 DIAGNOSIS — L814 Other melanin hyperpigmentation: Secondary | ICD-10-CM | POA: Diagnosis not present

## 2023-02-28 DIAGNOSIS — D1801 Hemangioma of skin and subcutaneous tissue: Secondary | ICD-10-CM | POA: Diagnosis not present

## 2023-02-28 DIAGNOSIS — L821 Other seborrheic keratosis: Secondary | ICD-10-CM | POA: Diagnosis not present

## 2023-02-28 DIAGNOSIS — Z85820 Personal history of malignant melanoma of skin: Secondary | ICD-10-CM | POA: Diagnosis not present

## 2023-02-28 DIAGNOSIS — B078 Other viral warts: Secondary | ICD-10-CM | POA: Diagnosis not present

## 2023-02-28 DIAGNOSIS — L720 Epidermal cyst: Secondary | ICD-10-CM | POA: Diagnosis not present

## 2023-02-28 DIAGNOSIS — L738 Other specified follicular disorders: Secondary | ICD-10-CM | POA: Diagnosis not present

## 2023-02-28 DIAGNOSIS — Z85828 Personal history of other malignant neoplasm of skin: Secondary | ICD-10-CM | POA: Diagnosis not present

## 2023-03-07 DIAGNOSIS — N401 Enlarged prostate with lower urinary tract symptoms: Secondary | ICD-10-CM | POA: Diagnosis not present

## 2023-03-07 DIAGNOSIS — R3914 Feeling of incomplete bladder emptying: Secondary | ICD-10-CM | POA: Diagnosis not present

## 2023-03-07 DIAGNOSIS — R310 Gross hematuria: Secondary | ICD-10-CM | POA: Diagnosis not present

## 2023-03-11 DIAGNOSIS — N320 Bladder-neck obstruction: Secondary | ICD-10-CM | POA: Diagnosis not present

## 2023-03-11 DIAGNOSIS — N281 Cyst of kidney, acquired: Secondary | ICD-10-CM | POA: Diagnosis not present

## 2023-03-11 DIAGNOSIS — R310 Gross hematuria: Secondary | ICD-10-CM | POA: Diagnosis not present

## 2023-03-11 DIAGNOSIS — C439 Malignant melanoma of skin, unspecified: Secondary | ICD-10-CM | POA: Diagnosis not present

## 2023-03-28 DIAGNOSIS — H31091 Other chorioretinal scars, right eye: Secondary | ICD-10-CM | POA: Diagnosis not present

## 2023-03-28 DIAGNOSIS — H43393 Other vitreous opacities, bilateral: Secondary | ICD-10-CM | POA: Diagnosis not present

## 2023-03-28 DIAGNOSIS — H43811 Vitreous degeneration, right eye: Secondary | ICD-10-CM | POA: Diagnosis not present

## 2023-03-28 DIAGNOSIS — H35371 Puckering of macula, right eye: Secondary | ICD-10-CM | POA: Diagnosis not present

## 2023-03-28 DIAGNOSIS — H2513 Age-related nuclear cataract, bilateral: Secondary | ICD-10-CM | POA: Diagnosis not present

## 2023-04-03 DIAGNOSIS — R310 Gross hematuria: Secondary | ICD-10-CM | POA: Diagnosis not present

## 2023-04-03 DIAGNOSIS — R3914 Feeling of incomplete bladder emptying: Secondary | ICD-10-CM | POA: Diagnosis not present

## 2023-04-03 DIAGNOSIS — N401 Enlarged prostate with lower urinary tract symptoms: Secondary | ICD-10-CM | POA: Diagnosis not present

## 2023-04-05 DIAGNOSIS — J069 Acute upper respiratory infection, unspecified: Secondary | ICD-10-CM | POA: Diagnosis not present

## 2023-04-05 DIAGNOSIS — Z03818 Encounter for observation for suspected exposure to other biological agents ruled out: Secondary | ICD-10-CM | POA: Diagnosis not present

## 2023-04-05 DIAGNOSIS — R509 Fever, unspecified: Secondary | ICD-10-CM | POA: Diagnosis not present

## 2023-04-07 ENCOUNTER — Other Ambulatory Visit: Payer: Self-pay

## 2023-04-07 ENCOUNTER — Emergency Department (HOSPITAL_COMMUNITY)
Admission: EM | Admit: 2023-04-07 | Discharge: 2023-04-07 | Disposition: A | Discharge status: Home / Self Care | Payer: PPO | Attending: Emergency Medicine | Admitting: Emergency Medicine

## 2023-04-07 ENCOUNTER — Encounter (HOSPITAL_COMMUNITY): Payer: Self-pay

## 2023-04-07 ENCOUNTER — Emergency Department (HOSPITAL_COMMUNITY): Payer: PPO

## 2023-04-07 DIAGNOSIS — D649 Anemia, unspecified: Secondary | ICD-10-CM | POA: Diagnosis not present

## 2023-04-07 DIAGNOSIS — N3000 Acute cystitis without hematuria: Secondary | ICD-10-CM | POA: Insufficient documentation

## 2023-04-07 DIAGNOSIS — R300 Dysuria: Secondary | ICD-10-CM | POA: Diagnosis present

## 2023-04-07 DIAGNOSIS — J90 Pleural effusion, not elsewhere classified: Secondary | ICD-10-CM | POA: Diagnosis not present

## 2023-04-07 DIAGNOSIS — D72829 Elevated white blood cell count, unspecified: Secondary | ICD-10-CM | POA: Diagnosis not present

## 2023-04-07 DIAGNOSIS — K5900 Constipation, unspecified: Secondary | ICD-10-CM | POA: Diagnosis not present

## 2023-04-07 DIAGNOSIS — N400 Enlarged prostate without lower urinary tract symptoms: Secondary | ICD-10-CM | POA: Diagnosis not present

## 2023-04-07 DIAGNOSIS — N289 Disorder of kidney and ureter, unspecified: Secondary | ICD-10-CM | POA: Diagnosis not present

## 2023-04-07 LAB — I-STAT CG4 LACTIC ACID, ED: Lactic Acid, Venous: 0.8 mmol/L (ref 0.5–1.9)

## 2023-04-07 LAB — CBC WITH DIFFERENTIAL/PLATELET
Abs Immature Granulocytes: 0.13 10*3/uL — ABNORMAL HIGH (ref 0.00–0.07)
Basophils Absolute: 0 10*3/uL (ref 0.0–0.1)
Basophils Relative: 0 %
Eosinophils Absolute: 0 10*3/uL (ref 0.0–0.5)
Eosinophils Relative: 0 %
HCT: 36.5 % — ABNORMAL LOW (ref 39.0–52.0)
Hemoglobin: 12.3 g/dL — ABNORMAL LOW (ref 13.0–17.0)
Immature Granulocytes: 1 %
Lymphocytes Relative: 4 %
Lymphs Abs: 0.5 10*3/uL — ABNORMAL LOW (ref 0.7–4.0)
MCH: 31.8 pg (ref 26.0–34.0)
MCHC: 33.7 g/dL (ref 30.0–36.0)
MCV: 94.3 fL (ref 80.0–100.0)
Monocytes Absolute: 0.9 10*3/uL (ref 0.1–1.0)
Monocytes Relative: 7 %
Neutro Abs: 11.7 10*3/uL — ABNORMAL HIGH (ref 1.7–7.7)
Neutrophils Relative %: 88 %
Platelets: 201 10*3/uL (ref 150–400)
RBC: 3.87 MIL/uL — ABNORMAL LOW (ref 4.22–5.81)
RDW: 12.3 % (ref 11.5–15.5)
WBC: 13.2 10*3/uL — ABNORMAL HIGH (ref 4.0–10.5)
nRBC: 0 % (ref 0.0–0.2)

## 2023-04-07 LAB — URINALYSIS, W/ REFLEX TO CULTURE (INFECTION SUSPECTED)
Bilirubin Urine: NEGATIVE
Glucose, UA: NEGATIVE mg/dL
Ketones, ur: 5 mg/dL — AB
Nitrite: NEGATIVE
Protein, ur: 100 mg/dL — AB
Specific Gravity, Urine: 1.013 (ref 1.005–1.030)
WBC, UA: >50 WBC/hpf (ref 0–5)
pH: 5 (ref 5.0–8.0)

## 2023-04-07 LAB — COMPREHENSIVE METABOLIC PANEL
ALT: 15 U/L (ref 0–44)
AST: 19 U/L (ref 15–41)
Albumin: 3.6 g/dL (ref 3.5–5.0)
Alkaline Phosphatase: 52 U/L (ref 38–126)
Anion gap: 9 (ref 5–15)
BUN: 20 mg/dL (ref 8–23)
CO2: 23 mmol/L (ref 22–32)
Calcium: 8.8 mg/dL — ABNORMAL LOW (ref 8.9–10.3)
Chloride: 101 mmol/L (ref 98–111)
Creatinine, Ser: 1.25 mg/dL — ABNORMAL HIGH (ref 0.61–1.24)
GFR, Estimated: >60 mL/min (ref >60)
Glucose, Bld: 120 mg/dL — ABNORMAL HIGH (ref 70–99)
Potassium: 3.9 mmol/L (ref 3.5–5.1)
Sodium: 133 mmol/L — ABNORMAL LOW (ref 135–145)
Total Bilirubin: 0.7 mg/dL (ref <1.2)
Total Protein: 6.8 g/dL (ref 6.5–8.1)

## 2023-04-07 LAB — LIPASE, BLOOD: Lipase: 26 U/L (ref 11–51)

## 2023-04-07 MED ORDER — SODIUM CHLORIDE 0.9 % IV SOLN
1.0000 g | Freq: Once | INTRAVENOUS | Status: AC
  Administered 2023-04-07: 1 g via INTRAVENOUS
  Filled 2023-04-07: qty 10

## 2023-04-07 MED ORDER — IOHEXOL 300 MG/ML  SOLN
100.0000 mL | Freq: Once | INTRAMUSCULAR | Status: AC | PRN
  Administered 2023-04-07: 100 mL via INTRAVENOUS

## 2023-04-07 MED ORDER — SODIUM CHLORIDE 0.9 % IV BOLUS
1000.0000 mL | Freq: Once | INTRAVENOUS | Status: AC
  Administered 2023-04-07: 1000 mL via INTRAVENOUS

## 2023-04-07 MED ORDER — PHENAZOPYRIDINE HCL 200 MG PO TABS
200.0000 mg | ORAL_TABLET | Freq: Three times a day (TID) | ORAL | 0 refills | Status: DC
Start: 2023-04-07 — End: 2023-05-13

## 2023-04-07 MED ORDER — CEPHALEXIN 500 MG PO CAPS: 500.0000 mg | ORAL_CAPSULE | Freq: Three times a day (TID) | ORAL | 0 refills | Status: DC

## 2023-04-07 MED ORDER — ACETAMINOPHEN 500 MG PO TABS
1000.0000 mg | ORAL_TABLET | Freq: Once | ORAL | Status: AC
  Administered 2023-04-07: 1000 mg via ORAL
  Filled 2023-04-07: qty 2

## 2023-04-07 MED ORDER — PHENAZOPYRIDINE HCL 200 MG PO TABS
200.0000 mg | ORAL_TABLET | Freq: Three times a day (TID) | ORAL | Status: DC
  Administered 2023-04-07: 200 mg via ORAL
  Filled 2023-04-07: qty 1

## 2023-04-07 NOTE — ED Provider Notes (Signed)
[facility] Provider Note   CSN: [id number] Arrival date & time: 04/07/23  0900     History  Chief Complaint  Patient presents with   Urinary Retention   Constipation    Alexander Ortega is a 72 y.o. male.  Pt is a 72 yo male with no significant pmhx.  Pt said he had a cystoscopy on 12/11.  He woke up the next day with fevers and chills.  He went to [facility] and had a covid/flu/rsv swab that was normal.  No urine sent.  Pt has been having dysuria and frequency since cystoscopy.  He did call urology on 12/13 and was given a rx for keflex, but just once a day.  He said he is feeling worse.  He can urinate, but it is very difficult to urinate.  He is also constipated.  He has tried an enema and dulcolax.  He's had a small bm, but nothing significant for a few days.       Home Medications Prior to Admission medications   Medication Sig Start Date End Date Taking? Authorizing Provider  alfuzosin (UROXATRAL) 10 MG 24 hr tablet Take 10 mg by mouth in the morning and at bedtime. 08/26/19  Yes [provider]  amitriptyline (ELAVIL) 25 MG tablet Take 12.5 mg by mouth at bedtime.    Yes [provider]  cephALEXin (KEFLEX) 500 MG capsule Take 500 mg by mouth daily. 04/05/23  Yes [provider]  cephALEXin (KEFLEX) 500 MG capsule Take 1 capsule (500 mg total) by mouth 3 (three) times daily. 04/07/23  Yes Jacalyn Lefevre, MD  cyanocobalamin (VITAMIN B12) 1000 MCG tablet Take 1,000 mcg by mouth daily.   Yes [provider]  docusate sodium (COLACE) 100 MG capsule Take 100 mg by mouth 2 (two) times daily.   Yes [provider]  ferrous sulfate 325 (65 FE) MG EC tablet Take 325 mg by mouth 4 (four) times a week. Sunday, Tuesday, Thursday, Saturday   Yes [provider]  Glucosamine HCl (GLUCOSAMINE PO) Take 1 tablet by mouth 2 (two) times daily.   Yes [provider]  ibuprofen (ADVIL) 200 MG  tablet Take 400 mg by mouth 2 (two) times daily as needed for moderate pain (pain score 4-6) or mild pain (pain score 1-3).   Yes [provider]  MAGNESIUM PO Take 1 tablet by mouth daily.   Yes [provider]  Melatonin 1 MG TABS Take 1 mg by mouth at bedtime.   Yes [provider]  Misc Natural Products (LUTEIN 20 PO) Take 1 tablet by mouth 2 (two) times daily.    Yes [provider]  Omega-3 Fatty Acids (FISH OIL) 1000 MG CPDR Take 1,000 mg by mouth daily.   Yes [provider]  ondansetron (ZOFRAN) 4 MG tablet Take 4 mg by mouth daily as needed for vomiting or nausea. 04/05/23  Yes [provider]  OVER THE COUNTER MEDICATION Take 1 tablet by mouth in the morning, at noon, and at bedtime. Prosta thrive   Yes [provider]  phenazopyridine (PYRIDIUM) 200 MG tablet Take 1 tablet (200 mg total) by mouth 3 (three) times daily. 04/07/23  Yes Jacalyn Lefevre, MD  rosuvastatin (CRESTOR) 5 MG tablet Take 5 mg by mouth daily. 11/04/19  Yes [provider]  Turmeric (QC TUMERIC COMPLEX) 500 MG CAPS Take 500 mg by mouth daily at 12 noon.   Yes [provider]  Allergies    Silodosin and Tamsulosin    Review of Systems   Review of Systems  Constitutional:  Positive for chills and fever.  Gastrointestinal:  Positive for constipation.  Genitourinary:  Positive for dysuria and frequency.  All other systems reviewed and are negative.   Physical Exam Updated Vital Signs BP 114/68 (BP Location: Right Arm)   Pulse 85   Temp 99.2 F (37.3 C) (Oral)   Resp 18   Ht 5\' 10"  (1.778 m)   Wt 79.4 kg   SpO2 98%   BMI 25.11 kg/m  Physical Exam Vitals and nursing note reviewed.  Constitutional:      Appearance: Normal appearance.  HENT:     Head: Normocephalic and atraumatic.     Right Ear: External ear normal.     Left Ear: External ear normal.     Nose: Nose normal.     Mouth/Throat:     Mouth: Mucous  membranes are moist.     Pharynx: Oropharynx is clear.  Eyes:     Extraocular Movements: Extraocular movements intact.     Conjunctiva/sclera: Conjunctivae normal.     Pupils: Pupils are equal, round, and reactive to light.  Cardiovascular:     Rate and Rhythm: Normal rate and regular rhythm.     Pulses: Normal pulses.     Heart sounds: Normal heart sounds.  Pulmonary:     Effort: Pulmonary effort is normal.     Breath sounds: Normal breath sounds.  Abdominal:     General: Abdomen is flat. Bowel sounds are normal.     Palpations: Abdomen is soft.     Tenderness: There is abdominal tenderness in the suprapubic area.  Musculoskeletal:        General: Normal range of motion.     Cervical back: Normal range of motion and neck supple.  Skin:    General: Skin is warm.     Capillary Refill: Capillary refill takes less than 2 seconds.  Neurological:     General: No focal deficit present.     Mental Status: He is alert and oriented to person, place, and time.  Psychiatric:        Mood and Affect: Mood normal.        Behavior: Behavior normal.     ED Results / Procedures / Treatments   Labs (all labs ordered are listed, but only abnormal results are displayed) Labs Reviewed  CBC WITH DIFFERENTIAL/PLATELET - Abnormal; Notable for the following components:      Result Value   WBC 13.2 (*)    RBC 3.87 (*)    Hemoglobin 12.3 (*)    HCT 36.5 (*)    Neutro Abs 11.7 (*)    Lymphs Abs 0.5 (*)    Abs Immature Granulocytes 0.13 (*)    All other components within normal limits  COMPREHENSIVE METABOLIC PANEL - Abnormal; Notable for the following components:   Sodium 133 (*)    Glucose, Bld 120 (*)    Creatinine, Ser 1.25 (*)    Calcium 8.8 (*)    All other components within normal limits  URINALYSIS, W/ REFLEX TO CULTURE (INFECTION SUSPECTED) - Abnormal; Notable for the following components:   APPearance CLOUDY (*)    Hgb urine dipstick MODERATE (*)    Ketones, ur 5 (*)    Protein,  ur 100 (*)    Leukocytes,Ua MODERATE (*)    Bacteria, UA RARE (*)    All other components within normal limits  URINE  CULTURE  CULTURE, BLOOD (ROUTINE X 2)  CULTURE, BLOOD (ROUTINE X 2)  LIPASE, BLOOD  I-STAT CG4 LACTIC ACID, ED    EKG None  Radiology CT ABDOMEN PELVIS W CONTRAST Result Date: 04/07/2023 CLINICAL DATA:  Difficulty urinating EXAM: CT ABDOMEN AND PELVIS WITH CONTRAST TECHNIQUE: Multidetector CT imaging of the abdomen and pelvis was performed using the standard protocol following bolus administration of intravenous contrast. RADIATION DOSE REDUCTION: This exam was performed according to the departmental dose-optimization program which includes automated exposure control, adjustment of the mA and/or kV according to patient size and/or use of iterative reconstruction technique. CONTRAST:  OMNIPAQUE IOHEXOL 300 MG/ML  SOLN COMPARISON:  03/11/2023 FINDINGS: Lower chest: No pleural or pericardial effusion. Linear scarring/atelectasis in the inferior lingula. Hepatobiliary: No focal liver abnormality is seen. No gallstones, gallbladder wall thickening, or biliary dilatation. Pancreas: Unremarkable. No pancreatic ductal dilatation or surrounding inflammatory changes. Spleen: Normal in size without focal abnormality. Adrenals/Urinary Tract: No adrenal mass. No evident urolithiasis or hydronephrosis. Multiple cortical lesions in both kidneys, some of which can be characterized as cysts, largest 7.5 cm 27 HU with linear peripheral calcifications left upper pole; no followup recommended. Urinary bladder physiologically distended with small right diverticula. Stomach/Bowel: The stomach decompressed, unremarkable. Small bowel nondistended. Normal appendix. The colon is nondistended, without acute finding. Vascular/Lymphatic: No AAA. Portal vein patent. Circumaortic left renal vein, anatomic variant. No abdominal or pelvic adenopathy. Reproductive: Marked prostate enlargement with protrusion  into the base of the urinary bladder, post urolift. Other: No ascites.  No free air. Musculoskeletal: Lumbar degenerative disc disease L2-3 and L5-S1. IMPRESSION: 1. No acute findings. 2. Marked prostate enlargement with protrusion into the base of the urinary bladder, post urolift. Electronically Signed   By: Corlis Leak M.D.   On: 04/07/2023 12:20   DG Abdomen Acute W/Chest Result Date: 04/07/2023 CLINICAL DATA:  constipation EXAM: DG ABDOMEN ACUTE WITH 1 VIEW CHEST COMPARISON:  01/13/2020 chest radiograph. FINDINGS: Stable cardiomediastinal silhouette with normal heart size. No pneumothorax. Trace right pleural effusion is new. No left pleural effusion. Lungs appear clear, with no acute consolidative airspace disease and no pulmonary edema. Minimally dilated central left abdominal small bowel loops with air-fluid levels. Surgical clips overlie the region of the prostate. No evidence of pneumatosis or pneumoperitoneum. IMPRESSION: 1. Minimally dilated central left abdominal small bowel loops with air-fluid levels. Mild partial mid to distal small bowel obstruction cannot be excluded, with differential including mild adynamic ileus. Further evaluation with CT abdomen/pelvis with oral and IV contrast may be obtained as clinically warranted. 2. Trace right pleural effusion, new. Electronically Signed   By: Delbert Phenix M.D.   On: 04/07/2023 11:00    Procedures Procedures    Medications Ordered in ED Medications  phenazopyridine (PYRIDIUM) tablet 200 mg (200 mg Oral Given 04/07/23 1149)  sodium chloride 0.9 % bolus 1,000 mL (0 mLs Intravenous Stopped 04/07/23 1153)  cefTRIAXone (ROCEPHIN) 1 g in sodium chloride 0.9 % 100 mL IVPB (0 g Intravenous Stopped 04/07/23 1327)  iohexol (OMNIPAQUE) 300 MG/ML solution 100 mL (100 mLs Intravenous Contrast Given 04/07/23 1153)    ED Course/ Medical Decision Making/ A&P                                 Medical Decision Making Amount and/or Complexity of Data  Reviewed Labs: ordered. Radiology: ordered.  Risk Prescription drug management.   This patient presents to the ED for  concern of dysuria, this involves an extensive number of treatment options, and is a complaint that carries with it a high risk of complications and morbidity.  The differential diagnosis includes uti, urinary retention, constipation   Co morbidities that complicate the patient evaluation  none   Additional history obtained:  Additional history obtained from epic chart review External records from outside source obtained and reviewed including wife   Lab Tests:  I Ordered, and personally interpreted labs.  The pertinent results include:  cbc with wbc sl elevated at 13.2 and hgb sl low at 12.3 (hgb 10.8 in 2021); ua + for UTI   Imaging Studies ordered:  I ordered imaging studies including abd/chest xr and ct abd/pelvis I independently visualized and interpreted imaging which showed  3 way abd: Minimally dilated central left abdominal small bowel loops with  air-fluid levels. Mild partial mid to distal small bowel obstruction  cannot be excluded, with differential including mild adynamic ileus.  Further evaluation with CT abdomen/pelvis with oral and IV contrast  may be obtained as clinically warranted.  2. Trace right pleural effusion, new.  CT abd/pelvis: No acute findings.  2. Marked prostate enlargement with protrusion into the base of the  urinary bladder, post urolift.   I agree with the radiologist interpretation   Cardiac Monitoring:  The patient was maintained on a cardiac monitor.  I personally viewed and interpreted the cardiac monitored which showed an underlying rhythm of: nsr   Medicines ordered and prescription drug management:  I ordered medication including ivfs/rocephin/pyridium for sx  Reevaluation of the patient after these medicines showed that the patient improved I have reviewed the patients home medicines and have made  adjustments as needed   Test Considered:  ct   Critical Interventions:  abx   Consultations Obtained:  I requested consultation with the urologist (Dr. Annabell Howells),  and discussed lab and imaging findings as well as pertinent plan - he recommends admission if he is showing signs of sepsis   Problem List / ED Course:  UTI:  pt is feeling much better after ivfs.  He is not retaining urine as he had 90cc of urine in bladder on scan.  Pt is given rocephin 1 g iv.  He is afebrile here and lactic is normal.  VS nl.  Blood and urine cultures ordered.  Pt offered admission, but wants to go home.  He is a responsible person and will return if worse.  He is to call urology tomorrow.   He is d/c with keflex (he did not want cipro) to take tid.     Reevaluation:  After the interventions noted above, I reevaluated the patient and found that they have :improved   Social Determinants of Health:  Lives at home   Dispostion:  After consideration of the diagnostic results and the patients response to treatment, I feel that the patent would benefit from discharge with outpatient f/u.          Final Clinical Impression(s) / ED Diagnoses Final diagnoses:  Acute cystitis without hematuria    Rx / DC Orders ED Discharge Orders          Ordered    phenazopyridine (PYRIDIUM) 200 MG tablet  3 times daily        04/07/23 1403    cephALEXin (KEFLEX) 500 MG capsule  3 times daily        04/07/23 1403              Jacalyn Lefevre,  MD 04/07/23 1409

## 2023-04-07 NOTE — ED Notes (Signed)
Pt provided discharge instructions and prescription information. Pt was given the opportunity to ask questions and questions were answered.   

## 2023-04-07 NOTE — ED Notes (Signed)
Pt attempted a fleet enema yesterday with little success

## 2023-04-07 NOTE — ED Triage Notes (Signed)
Pt complaining of difficulty urinating, pt states he was able to pee enough to get him through the night. Pt has hx of prostate issues, also had a cystotomy on Wednesday morning. Pt also concerned that he may be constipated, last normal BM was several days ago.

## 2023-04-08 LAB — URINE CULTURE: Culture: NO GROWTH

## 2023-04-09 ENCOUNTER — Ambulatory Visit: Payer: Self-pay | Admitting: Urology

## 2023-04-09 DIAGNOSIS — N401 Enlarged prostate with lower urinary tract symptoms: Secondary | ICD-10-CM | POA: Diagnosis not present

## 2023-04-09 DIAGNOSIS — R3914 Feeling of incomplete bladder emptying: Secondary | ICD-10-CM | POA: Diagnosis not present

## 2023-04-10 DIAGNOSIS — R3914 Feeling of incomplete bladder emptying: Secondary | ICD-10-CM | POA: Diagnosis not present

## 2023-04-10 DIAGNOSIS — N401 Enlarged prostate with lower urinary tract symptoms: Secondary | ICD-10-CM | POA: Diagnosis not present

## 2023-04-12 LAB — CULTURE, BLOOD (ROUTINE X 2)
Culture: NO GROWTH
Culture: NO GROWTH

## 2023-04-30 ENCOUNTER — Other Ambulatory Visit: Payer: Self-pay

## 2023-04-30 DIAGNOSIS — N401 Enlarged prostate with lower urinary tract symptoms: Secondary | ICD-10-CM

## 2023-05-01 ENCOUNTER — Encounter: Payer: Self-pay | Admitting: Urology

## 2023-05-01 ENCOUNTER — Other Ambulatory Visit: Payer: Self-pay | Admitting: Urology

## 2023-05-01 ENCOUNTER — Ambulatory Visit: Payer: PPO | Admitting: Urology

## 2023-05-01 ENCOUNTER — Other Ambulatory Visit: Payer: Self-pay

## 2023-05-01 ENCOUNTER — Other Ambulatory Visit
Admission: RE | Admit: 2023-05-01 | Discharge: 2023-05-01 | Disposition: A | Discharge status: Home / Self Care | Payer: PPO | Attending: Urology | Admitting: Urology

## 2023-05-01 VITALS — BP 116/66 | HR 77 | Ht 71.0 in | Wt 169.0 lb

## 2023-05-01 DIAGNOSIS — N138 Other obstructive and reflux uropathy: Secondary | ICD-10-CM | POA: Insufficient documentation

## 2023-05-01 DIAGNOSIS — N401 Enlarged prostate with lower urinary tract symptoms: Secondary | ICD-10-CM

## 2023-05-01 LAB — URINALYSIS, COMPLETE (UACMP) WITH MICROSCOPIC
Bilirubin Urine: NEGATIVE
Glucose, UA: NEGATIVE mg/dL
Ketones, ur: NEGATIVE mg/dL
Nitrite: POSITIVE — AB
Protein, ur: NEGATIVE mg/dL
Specific Gravity, Urine: 1.015 (ref 1.005–1.030)
pH: 7 (ref 5.0–8.0)

## 2023-05-01 MED ORDER — OXYBUTYNIN CHLORIDE ER 10 MG PO TB24: 10.0000 mg | ORAL_TABLET | Freq: Every day | ORAL | 0 refills | Status: DC | PRN

## 2023-05-01 NOTE — H&P (View-Only) (Signed)
   05/01/23 1:57 PM   Alexander Ortega 09-Dec-1950 [id number]  CC: BPH and retention, elevated PSA  HPI: 73 year old male referred from Dr. Mena Goes at [facility] urology for the above issues.  He is specifically interested in HOLEP of his large median lobe to preserve ejaculatory function.  He has a long history of elevated PSA with negative prostate MRI and prominent median lobe.  He also has obstructive urinary symptoms and underwent a UroLift in 2019.  Remains on alfuzosin.  He underwent a cystoscopy with [facility] in December and returned in urinary retention and a Foley catheter was placed with 800 mL urine output.  He has had mildly elevated PSA likely secondary to BPH, prostate volume on MRI from June 2024 was 90 g.  Surgical History: Past Surgical History:  Procedure Laterality Date   CYSTOSCOPY WITH INSERTION OF UROLIFT  Spring 2019   prosate mri  2010   prostate biospy  2010    Social History:  reports that he has never smoked. He has never used smokeless tobacco. He reports that he does not currently use alcohol. He reports that he does not use drugs.  Physical Exam: BP 116/66 (BP Location: Left Arm, Patient Position: Sitting, Cuff Size: Large)   Pulse 77   Ht 5\' 11"  (1.803 m)   Wt 169 lb (76.7 kg)   SpO2 99%   BMI 23.57 kg/m    Constitutional:  Alert and oriented, No acute distress. Cardiovascular: No clubbing, cyanosis, or edema. Respiratory: Normal respiratory effort, no increased work of breathing. GI: Abdomen is soft, nontender, nondistended, no abdominal masses   Laboratory Data: Reviewed, see HPI  Pertinent Imaging: I have personally viewed and interpreted the recent CT abdomen and pelvis with contrast as well as the prostate MRI from June 2024 showing a large prostate with significant median lobe component, 90 g size, UroLift clips present.  Assessment & Plan:   73 year old male with long history of BPH, UroLift 2019, recent urinary retention with  catheter currently in place.  He is adamant about preserving ejaculatory function, and is interested in median lobe only HOLEP.  We discussed the risks and benefits of HoLEP at length.  The procedure requires general anesthesia and takes 1 to 2 hours, and a holmium laser is used to enucleate the prostate and push this tissue into the bladder.  A morcellator is then used to remove this tissue, which is sent for pathology.  The vast majority(>95%) of patients are able to discharge the same day with a catheter in place for 2 to 3 days, and will follow-up in clinic for a voiding trial.  We specifically discussed the risks of bleeding, infection, retrograde ejaculation, temporary urgency and urge incontinence, very low risk of long-term incontinence, urethral stricture/bladder neck contracture, pathologic evaluation of prostate tissue and possible detection of prostate cancer or other malignancy, and possible need for additional procedures.  We discussed risk of persistent urinary symptoms or retention since we are only addressing the middle lobe, but this would be the best way to get him catheter free and preserve his ejaculatory function.  He understands possible need for additional procedures in the future if persistent urinary symptoms or retention.  Schedule HOLEP(median lobe only) next week Oxybutynin sent in for any bladder spasms   Legrand Rams, MD 05/01/2023  [facility] [address] [place], [place] [address] [phone]

## 2023-05-01 NOTE — Patient Instructions (Signed)

## 2023-05-01 NOTE — Progress Notes (Signed)
   05/01/23 1:57 PM   Alexander Ortega 09-Dec-1950 [id number]  CC: BPH and retention, elevated PSA  HPI: 73 year old male referred from Dr. Mena Goes at [facility] urology for the above issues.  He is specifically interested in HOLEP of his large median lobe to preserve ejaculatory function.  He has a long history of elevated PSA with negative prostate MRI and prominent median lobe.  He also has obstructive urinary symptoms and underwent a UroLift in 2019.  Remains on alfuzosin.  He underwent a cystoscopy with [facility] in December and returned in urinary retention and a Foley catheter was placed with 800 mL urine output.  He has had mildly elevated PSA likely secondary to BPH, prostate volume on MRI from June 2024 was 90 g.  Surgical History: Past Surgical History:  Procedure Laterality Date   CYSTOSCOPY WITH INSERTION OF UROLIFT  Spring 2019   prosate mri  2010   prostate biospy  2010    Social History:  reports that he has never smoked. He has never used smokeless tobacco. He reports that he does not currently use alcohol. He reports that he does not use drugs.  Physical Exam: BP 116/66 (BP Location: Left Arm, Patient Position: Sitting, Cuff Size: Large)   Pulse 77   Ht 5\' 11"  (1.803 m)   Wt 169 lb (76.7 kg)   SpO2 99%   BMI 23.57 kg/m    Constitutional:  Alert and oriented, No acute distress. Cardiovascular: No clubbing, cyanosis, or edema. Respiratory: Normal respiratory effort, no increased work of breathing. GI: Abdomen is soft, nontender, nondistended, no abdominal masses   Laboratory Data: Reviewed, see HPI  Pertinent Imaging: I have personally viewed and interpreted the recent CT abdomen and pelvis with contrast as well as the prostate MRI from June 2024 showing a large prostate with significant median lobe component, 90 g size, UroLift clips present.  Assessment & Plan:   73 year old male with long history of BPH, UroLift 2019, recent urinary retention with  catheter currently in place.  He is adamant about preserving ejaculatory function, and is interested in median lobe only HOLEP.  We discussed the risks and benefits of HoLEP at length.  The procedure requires general anesthesia and takes 1 to 2 hours, and a holmium laser is used to enucleate the prostate and push this tissue into the bladder.  A morcellator is then used to remove this tissue, which is sent for pathology.  The vast majority(>95%) of patients are able to discharge the same day with a catheter in place for 2 to 3 days, and will follow-up in clinic for a voiding trial.  We specifically discussed the risks of bleeding, infection, retrograde ejaculation, temporary urgency and urge incontinence, very low risk of long-term incontinence, urethral stricture/bladder neck contracture, pathologic evaluation of prostate tissue and possible detection of prostate cancer or other malignancy, and possible need for additional procedures.  We discussed risk of persistent urinary symptoms or retention since we are only addressing the middle lobe, but this would be the best way to get him catheter free and preserve his ejaculatory function.  He understands possible need for additional procedures in the future if persistent urinary symptoms or retention.  Schedule HOLEP(median lobe only) next week Oxybutynin sent in for any bladder spasms   Legrand Rams, MD 05/01/2023  [facility] [address] [place], [place] [address] [phone]

## 2023-05-01 NOTE — Progress Notes (Signed)
 Surgical Physician Order Form [facility] [place]  Dr. Redell Burnet, MD  * Scheduling expectation : 1/17  *Length of Case: 1 hour  *Clearance needed: no  *Anticoagulation Instructions: Hold all anticoagulants  *Aspirin Instructions: Hold Aspirin  *Post-op visit Date/Instructions:  1-3 day cath removal  *Diagnosis: BPH w/BOO  *Procedure:  HOLEP (47350)   Additional orders: N/A  -Admit type: OUTpatient  -Anesthesia: General  -VTE Prophylaxis Standing Order SCD's       Other:   -Standing Lab Orders Per Anesthesia    Lab other: UA&Urine Culture sent 1/8  -Standing Test orders EKG/Chest x-ray per Anesthesia       Test other:   - Medications:  Ancef  2gm IV  -Other orders:  N/A

## 2023-05-02 ENCOUNTER — Telehealth: Payer: Self-pay

## 2023-05-02 NOTE — Telephone Encounter (Signed)
  Per Dr. Francisca, Patient is to be scheduled for Holmium Laser Enucleation of the Prostate   Alexander Ortega was contacted and possible surgical dates were discussed, Friday January 17th, 2025 was agreed upon for surgery.   Patient was directed to call [phone] between 1-3pm the day before surgery to find out surgical arrival time.  Instructions were given not to eat or drink from midnight on the night before surgery and have a driver for the day of surgery. On the surgery day patient was instructed to enter through the [facility] entrance of [facility] report the Same Day Surgery desk.   Pre-Admit Testing will be in contact via phone to set up an interview with the anesthesia team to review your history and medications prior to surgery.   Reminder of this information was sent via MyChart to the patient.

## 2023-05-02 NOTE — Progress Notes (Signed)
   [facility] Surgical Posting Form  Surgery Date: Date: 05/10/2023  Surgeon: Dr. Redell Burnet, MD  Inpt ( No  )   Outpt (Yes)   Obs ( No  )   Diagnosis: N40.1, N13.8 Benign Prostatic Hyperplasia with Urinary Obstruction  -CPT: 47350  Surgery: Holmium Laser Enucleation of the Prostate  Stop Anticoagulations: Yes and also hold ASA   Cardiac/Medical/Pulmonary Clearance needed: no  *Orders entered into EPIC  Date: 05/02/23   *Case booked in EPIC  Date: 05/02/23  *Notified pt of Surgery: Date: 05/02/23  PRE-OP UA & CX: yes, obtained in clinic on 05/01/2023  *Placed into Prior Authorization Work [place] Date: 05/02/23  Assistant/laser/rep:No

## 2023-05-03 ENCOUNTER — Telehealth: Payer: Self-pay

## 2023-05-03 LAB — URINE CULTURE: Culture: >=100000 — AB

## 2023-05-03 MED ORDER — SULFAMETHOXAZOLE-TRIMETHOPRIM 800-160 MG PO TABS: 1.0000 | ORAL_TABLET | Freq: Two times a day (BID) | ORAL | 0 refills | Status: DC

## 2023-05-03 NOTE — Telephone Encounter (Signed)
 Spoke with pt. Pt. Advised of all results and verbalized understanding. Medication has been sent in to pharmacy of choice. Patient made aware to start today and that he may be called if any changes need to be made.

## 2023-05-03 NOTE — Telephone Encounter (Signed)
-----   Message from Redell JAYSON Burnet sent at 05/03/2023  7:39 AM EST ----- Please start Bactrim  DS twice daily x 7 days to sterilize urine prior to upcoming HOLEP, will call if need to change antibiotics based on culture results  Redell Burnet, MD 05/03/2023

## 2023-05-07 ENCOUNTER — Other Ambulatory Visit: Payer: Self-pay

## 2023-05-07 ENCOUNTER — Encounter
Admission: RE | Admit: 2023-05-07 | Discharge: 2023-05-07 | Disposition: A | Discharge status: Home / Self Care | Payer: PPO | Source: Ambulatory Visit | Attending: Urology | Admitting: Urology

## 2023-05-07 VITALS — Ht 71.0 in | Wt 167.0 lb

## 2023-05-07 DIAGNOSIS — E785 Hyperlipidemia, unspecified: Secondary | ICD-10-CM

## 2023-05-07 HISTORY — DX: Anemia, unspecified: D64.9

## 2023-05-07 HISTORY — DX: Unspecified malignant neoplasm of skin, unspecified: C44.90

## 2023-05-07 HISTORY — DX: Pneumonia, unspecified organism: J18.9

## 2023-05-07 NOTE — Patient Instructions (Signed)
 Your procedure is scheduled on: Friday 05/10/23 To find out your arrival time, please call [phone] between 1PM - 3PM on:   Thursday 05/09/23 Report to the Registration Desk on the 1st floor of the [facility]. FREE Valet parking is available.  If your arrival time is 6:00 am, do not arrive before that time as the [facility] entrance doors do not open until 6:00 am.  REMEMBER: Instructions that are not followed completely may result in serious medical risk, up to and including death; or upon the discretion of your surgeon and anesthesiologist your surgery may need to be rescheduled.  Do not eat food or drink any liquids after midnight the night before surgery.  No gum chewing or hard candies.  One week prior to surgery: Stop Anti-inflammatories (NSAIDS) such as Advil, Aleve, Ibuprofen, Motrin, Naproxen, Naprosyn and Aspirin based products such as Excedrin, Goody's Powder, BC Powder. You may however, continue to take Tylenol  if needed for pain up until the day of surgery.  Stop ANY OVER THE COUNTER supplements and vitamins until after surgery.  Continue taking all prescribed medications.  TAKE ONLY THESE MEDICATIONS THE MORNING OF SURGERY WITH A SIP OF WATER:  alfuzosin (UROXATRAL) 10 MG 24 hr tablet  oxybutynin  (DITROPAN -XL) 10 MG 24 hr tablet  rosuvastatin (CRESTOR) 5 MG tablet   No Alcohol for 24 hours before or after surgery.  No Smoking including e-cigarettes for 24 hours before surgery.  No chewable tobacco products for at least 6 hours before surgery.  No nicotine patches on the day of surgery.  Do not use any recreational drugs for at least a week (preferably 2 weeks) before your surgery.  Please be advised that the combination of cocaine and anesthesia may have negative outcomes, up to and including death. If you test positive for cocaine, your surgery will be cancelled.  On the morning of surgery brush your teeth with toothpaste and water, you may rinse your  mouth with mouthwash if you wish. Do not swallow any toothpaste or mouthwash.  Use CHG Soap or wipes as directed on instruction sheet. Shower with your regular soap as usual.  Do not wear lotions, powders, or perfumes.   Do not shave body hair from the neck down 48 hours before surgery.  Wear clean comfortable clothing (specific to your surgery type) to the hospital.  Do not wear jewelry, make-up, hairpins, clips or nail polish.  For welded (permanent) jewelry: bracelets, anklets, waist bands, etc.  Please have this removed prior to surgery.  If it is not removed, there is a chance that hospital personnel will need to cut it off on the day of surgery. Contact lenses, hearing aids and dentures may not be worn into surgery.  Do not bring valuables to the hospital. [facility] is not responsible for any missing/lost belongings or valuables.   Notify your doctor if there is any change in your medical condition (cold, fever, infection).  If you are being discharged the day of surgery, you will not be allowed to drive home. You will need a responsible individual to drive you home and stay with you for 24 hours after surgery.   If you are taking public transportation, you will need to have a responsible individual with you.  If you are being admitted to the hospital overnight, leave your suitcase in the car. After surgery it may be brought to your room.  In case of increased patient census, it may be necessary for you, the patient, to continue  your postoperative care in the Same Day Surgery department.  After surgery, you can help prevent lung complications by doing breathing exercises.  Take deep breaths and cough every 1-2 hours. Your doctor may order a device called an Incentive Spirometer to help you take deep breaths. When coughing or sneezing, hold a pillow firmly against your incision with both hands. This is called "splinting." Doing this helps protect your incision. It also decreases  belly discomfort.  Surgery Visitation Policy:  Patients undergoing a surgery or procedure may have two family members or support persons with them as long as the person is not COVID-19 positive or experiencing its symptoms.   Inpatient Visitation:    Visiting hours are 7 a.m. to 8 p.m. Up to four visitors are allowed at one time in a patient room. The visitors may rotate out with other people during the day. One designated support person (adult) may remain overnight.  Due to an increase in RSV and influenza rates and associated hospitalizations, children ages 52 and under will not be able to visit patients in [facility]. Masks continue to be strongly recommended.  Please call the Pre-admissions Testing Dept. at [phone] if you have any questions about these instructions.

## 2023-05-08 ENCOUNTER — Encounter
Admission: RE | Admit: 2023-05-08 | Discharge: 2023-05-08 | Disposition: A | Discharge status: Home / Self Care | Payer: PPO | Source: Ambulatory Visit | Attending: Urology | Admitting: Urology

## 2023-05-08 DIAGNOSIS — Z01810 Encounter for preprocedural cardiovascular examination: Secondary | ICD-10-CM | POA: Insufficient documentation

## 2023-05-08 DIAGNOSIS — E785 Hyperlipidemia, unspecified: Secondary | ICD-10-CM | POA: Insufficient documentation

## 2023-05-09 MED ORDER — CEFAZOLIN SODIUM-DEXTROSE 2-4 GM/100ML-% IV SOLN
2.0000 g | INTRAVENOUS | Status: AC
  Administered 2023-05-10: 2 g via INTRAVENOUS

## 2023-05-09 MED ORDER — ORAL CARE MOUTH RINSE: 15.0000 mL | Freq: Once | OROMUCOSAL | Status: AC

## 2023-05-09 MED ORDER — CHLORHEXIDINE GLUCONATE 0.12 % MT SOLN
15.0000 mL | Freq: Once | OROMUCOSAL | Status: AC
  Administered 2023-05-10: 15 mL via OROMUCOSAL

## 2023-05-10 ENCOUNTER — Encounter
Admission: RE | Disposition: A | Discharge status: Home / Self Care | Payer: Self-pay | Source: Home / Self Care | Attending: Urology

## 2023-05-10 ENCOUNTER — Ambulatory Visit: Payer: PPO | Admitting: Urgent Care

## 2023-05-10 ENCOUNTER — Other Ambulatory Visit: Payer: Self-pay

## 2023-05-10 ENCOUNTER — Encounter: Payer: Self-pay | Admitting: Urology

## 2023-05-10 ENCOUNTER — Ambulatory Visit
Admission: RE | Admit: 2023-05-10 | Discharge: 2023-05-10 | Disposition: A | Discharge status: Home / Self Care | Payer: PPO | Attending: Urology | Admitting: Urology

## 2023-05-10 ENCOUNTER — Ambulatory Visit: Payer: PPO | Admitting: Anesthesiology

## 2023-05-10 DIAGNOSIS — N3289 Other specified disorders of bladder: Secondary | ICD-10-CM | POA: Insufficient documentation

## 2023-05-10 DIAGNOSIS — N138 Other obstructive and reflux uropathy: Secondary | ICD-10-CM | POA: Diagnosis not present

## 2023-05-10 DIAGNOSIS — N401 Enlarged prostate with lower urinary tract symptoms: Secondary | ICD-10-CM | POA: Diagnosis not present

## 2023-05-10 HISTORY — PX: HOLEP-LASER ENUCLEATION OF THE PROSTATE WITH MORCELLATION: SHX6641

## 2023-05-10 SURGERY — ENUCLEATION, PROSTATE, USING LASER, WITH MORCELLATION
Anesthesia: General | Site: Prostate

## 2023-05-10 MED ORDER — SODIUM CHLORIDE 0.9 % IR SOLN
Status: DC | PRN
  Administered 2023-05-10: 12000 mL via INTRAVESICAL
  Administered 2023-05-10: 21000 mL via INTRAVESICAL

## 2023-05-10 MED ORDER — DEXMEDETOMIDINE HCL IN NACL 80 MCG/20ML IV SOLN
INTRAVENOUS | Status: DC | PRN
  Administered 2023-05-10: 8 ug via INTRAVENOUS

## 2023-05-10 MED ORDER — CHLORHEXIDINE GLUCONATE 0.12 % MT SOLN
OROMUCOSAL | Status: AC
  Filled 2023-05-10: qty 15

## 2023-05-10 MED ORDER — FENTANYL CITRATE (PF) 100 MCG/2ML IJ SOLN
INTRAMUSCULAR | Status: AC
  Filled 2023-05-10: qty 2

## 2023-05-10 MED ORDER — LACTATED RINGERS IV SOLN: INTRAVENOUS | Status: DC

## 2023-05-10 MED ORDER — TRAMADOL HCL 50 MG PO TABS: 25.0000 mg | ORAL_TABLET | Freq: Four times a day (QID) | ORAL | 0 refills | Status: AC | PRN

## 2023-05-10 MED ORDER — ACETAMINOPHEN 10 MG/ML IV SOLN
INTRAVENOUS | Status: AC
  Filled 2023-05-10: qty 100

## 2023-05-10 MED ORDER — FENTANYL CITRATE (PF) 100 MCG/2ML IJ SOLN
INTRAMUSCULAR | Status: DC | PRN
  Administered 2023-05-10 (×2): 50 ug via INTRAVENOUS

## 2023-05-10 MED ORDER — DROPERIDOL 2.5 MG/ML IJ SOLN: 0.6250 mg | Freq: Once | INTRAMUSCULAR | Status: DC | PRN

## 2023-05-10 MED ORDER — PHENYLEPHRINE 80 MCG/ML (10ML) SYRINGE FOR IV PUSH (FOR BLOOD PRESSURE SUPPORT)
PREFILLED_SYRINGE | INTRAVENOUS | Status: DC | PRN
  Administered 2023-05-10 (×2): 80 ug via INTRAVENOUS

## 2023-05-10 MED ORDER — SUGAMMADEX SODIUM 200 MG/2ML IV SOLN
INTRAVENOUS | Status: DC | PRN
  Administered 2023-05-10: 200 mg via INTRAVENOUS

## 2023-05-10 MED ORDER — CEFAZOLIN SODIUM-DEXTROSE 2-4 GM/100ML-% IV SOLN
INTRAVENOUS | Status: AC
  Filled 2023-05-10: qty 100

## 2023-05-10 MED ORDER — ONDANSETRON HCL 4 MG/2ML IJ SOLN
INTRAMUSCULAR | Status: DC | PRN
  Administered 2023-05-10: 4 mg via INTRAVENOUS

## 2023-05-10 MED ORDER — DEXAMETHASONE SODIUM PHOSPHATE 10 MG/ML IJ SOLN
INTRAMUSCULAR | Status: AC
  Filled 2023-05-10: qty 1

## 2023-05-10 MED ORDER — FENTANYL CITRATE (PF) 100 MCG/2ML IJ SOLN
25.0000 ug | INTRAMUSCULAR | Status: AC | PRN
  Administered 2023-05-10 (×6): 25 ug via INTRAVENOUS

## 2023-05-10 MED ORDER — ACETAMINOPHEN 10 MG/ML IV SOLN
INTRAVENOUS | Status: DC | PRN
  Administered 2023-05-10: 1000 mg via INTRAVENOUS

## 2023-05-10 MED ORDER — PROPOFOL 10 MG/ML IV BOLUS
INTRAVENOUS | Status: DC | PRN
  Administered 2023-05-10: 200 mg via INTRAVENOUS

## 2023-05-10 MED ORDER — ROCURONIUM BROMIDE 100 MG/10ML IV SOLN
INTRAVENOUS | Status: DC | PRN
  Administered 2023-05-10: 50 mg via INTRAVENOUS

## 2023-05-10 MED ORDER — EPHEDRINE SULFATE-NACL 50-0.9 MG/10ML-% IV SOSY
PREFILLED_SYRINGE | INTRAVENOUS | Status: DC | PRN
  Administered 2023-05-10: 5 mg via INTRAVENOUS
  Administered 2023-05-10 (×2): 10 mg via INTRAVENOUS

## 2023-05-10 MED ORDER — LIDOCAINE HCL (CARDIAC) PF 100 MG/5ML IV SOSY
PREFILLED_SYRINGE | INTRAVENOUS | Status: DC | PRN
  Administered 2023-05-10: 100 mg via INTRAVENOUS

## 2023-05-10 MED ORDER — DEXAMETHASONE SODIUM PHOSPHATE 10 MG/ML IJ SOLN
INTRAMUSCULAR | Status: DC | PRN
  Administered 2023-05-10: 10 mg via INTRAVENOUS

## 2023-05-10 SURGICAL SUPPLY — 30 items
ADAPTER IRRIG TUBE 2 SPIKE SOL (ADAPTER) ×4 IMPLANT
BAG URO DRAIN 4000ML (MISCELLANEOUS) ×2 IMPLANT
BASKET LASER NITINOL 1.9FR (BASKET) IMPLANT
CATH URETL OPEN END 4X70 (CATHETERS) ×2 IMPLANT
CATH URTH STD 24FR FL 3W 2 (CATHETERS) ×2 IMPLANT
CONTAINER COLLECT MORCELLATR (MISCELLANEOUS) ×2 IMPLANT
ELECT BIVAP BIPO 22/24 DONUT (ELECTROSURGICAL)
ELECTRD BIVAP BIPO 22/24 DONUT (ELECTROSURGICAL) IMPLANT
FIBER LASER MOSES 550 DFL (Laser) ×2 IMPLANT
FILTER OVERFLOW MORCELLATOR (FILTER) ×2 IMPLANT
GLOVE BIOGEL PI IND STRL 7.5 (GLOVE) ×2 IMPLANT
GOWN STRL REUS W/ TWL LRG LVL3 (GOWN DISPOSABLE) ×2 IMPLANT
GOWN STRL REUS W/ TWL XL LVL3 (GOWN DISPOSABLE) ×2 IMPLANT
HOLDER FOLEY CATH W/STRAP (MISCELLANEOUS) ×2 IMPLANT
IV NS IRRIG 3000ML ARTHROMATIC (IV SOLUTION) ×10 IMPLANT
KIT TURNOVER CYSTO (KITS) ×2 IMPLANT
MBRN O SEALING YLW 17 FOR INST (MISCELLANEOUS) ×1
MEMBRANE SLNG YLW 17 FOR INST (MISCELLANEOUS) ×2 IMPLANT
MORCELLATOR COLLECT CONTAINER (MISCELLANEOUS) ×1
MORCELLATOR OVERFLOW FILTER (FILTER) ×1
MORCELLATOR ROTATION 4.75 335 (MISCELLANEOUS) ×2 IMPLANT
PACK CYSTO AR (MISCELLANEOUS) ×2 IMPLANT
SET CYSTO W/LG BORE CLAMP LF (SET/KITS/TRAYS/PACK) ×2 IMPLANT
SET IRRIG Y TYPE TUR BLADDER L (SET/KITS/TRAYS/PACK) ×2 IMPLANT
SLEEVE PROTECTION STRL DISP (MISCELLANEOUS) ×4 IMPLANT
SURGILUBE 2OZ TUBE FLIPTOP (MISCELLANEOUS) ×2 IMPLANT
SYR TOOMEY IRRIG 70ML (MISCELLANEOUS) ×1
SYRINGE TOOMEY IRRIG 70ML (MISCELLANEOUS) ×2 IMPLANT
TUBE PUMP MORCELLATOR PIRANHA (TUBING) ×2 IMPLANT
WATER STERILE IRR 1000ML POUR (IV SOLUTION) ×2 IMPLANT

## 2023-05-10 NOTE — Anesthesia Preprocedure Evaluation (Signed)
Anesthesia Evaluation  Patient identified by MRN, date of birth, ID band Patient awake    Reviewed: Allergy & Precautions, H&P , NPO status , Patient's Chart, lab work & pertinent test results, reviewed documented beta blocker date and time   History of Anesthesia Complications Negative for: history of anesthetic complications  Airway Mallampati: I  TM Distance: >3 FB Neck ROM: full    Dental  (+) Dental Advidsory Given, Caps, Teeth Intact   Pulmonary neg pulmonary ROS   Pulmonary exam normal breath sounds clear to auscultation       Cardiovascular Exercise Tolerance: Good negative cardio ROS Normal cardiovascular exam Rhythm:regular Rate:Normal     Neuro/Psych negative neurological ROS  negative psych ROS   GI/Hepatic negative GI ROS, Neg liver ROS,,,  Endo/Other  negative endocrine ROS    Renal/GU negative Renal ROS  negative genitourinary   Musculoskeletal   Abdominal   Peds  Hematology negative hematology ROS (+)   Anesthesia Other Findings Past Medical History: No date: Anemia No date: Pneumonia No date: Skin cancer     Comment:  melanoma 2006, 2017   Reproductive/Obstetrics negative OB ROS                             Anesthesia Physical Anesthesia Plan  ASA: 2  Anesthesia Plan: General   Post-op Pain Management:    Induction: Intravenous  PONV Risk Score and Plan: 2 and Ondansetron, Dexamethasone, Midazolam and Treatment may vary due to age or medical condition  Airway Management Planned: Oral ETT  Additional Equipment:   Intra-op Plan:   Post-operative Plan: Extubation in OR  Informed Consent: I have reviewed the patients History and Physical, chart, labs and discussed the procedure including the risks, benefits and alternatives for the proposed anesthesia with the patient or authorized representative who has indicated his/her understanding and acceptance.      Dental Advisory Given  Plan Discussed with: Anesthesiologist, CRNA and Surgeon  Anesthesia Plan Comments:        Anesthesia Quick Evaluation

## 2023-05-10 NOTE — Anesthesia Procedure Notes (Signed)
Procedure Name: Intubation Date/Time: 05/10/2023 11:43 AM  Performed by: Cheral Bay, CRNAPre-anesthesia Checklist: Patient identified, Emergency Drugs available, Suction available and Patient being monitored Patient Re-evaluated:Patient Re-evaluated prior to induction Oxygen Delivery Method: Circle system utilized Preoxygenation: Pre-oxygenation with 100% oxygen Induction Type: IV induction Ventilation: Mask ventilation without difficulty Laryngoscope Size: McGrath and 4 Grade View: Grade I Tube type: Oral Number of attempts: 1 Airway Equipment and Method: Stylet and Video-laryngoscopy Placement Confirmation: ETT inserted through vocal cords under direct vision, positive ETCO2 and breath sounds checked- equal and bilateral Secured at: 21 cm Tube secured with: Tape

## 2023-05-10 NOTE — Transfer of Care (Signed)
Immediate Anesthesia Transfer of Care Note  Patient: Alexander Ortega  Procedure(s) Performed: HOLEP-LASER ENUCLEATION OF THE PROSTATE WITH MORCELLATION (Prostate)  Patient Location: PACU  Anesthesia Type:General  Level of Consciousness: awake  Airway & Oxygen Therapy: Patient Spontanous Breathing  Post-op Assessment: Report given to RN and Post -op Vital signs reviewed and stable  Post vital signs: Reviewed and stable  Last Vitals:  Vitals Value Taken Time  BP 97/61 05/10/23 1300  Temp    Pulse 72 05/10/23 1302  Resp    SpO2 98 % 05/10/23 1302  Vitals shown include unfiled device data.  Last Pain:  Vitals:   05/10/23 0901  TempSrc: Temporal  PainSc: 0-No pain         Complications: No notable events documented.

## 2023-05-10 NOTE — Op Note (Signed)
Date of procedure: 05/10/23  Preoperative diagnosis:  BPH with obstruction  Postoperative diagnosis:  Same  Procedure: HoLEP (Holmium Laser Enucleation of the Prostate)  Surgeon: Legrand Rams, MD  Anesthesia: General  Complications: None  Intraoperative findings:  Large prostate with large median lobe that wrapped laterally to the left side Moderate bladder trabeculations Uncomplicated HOLEP of large median lobe, excellent hemostasis, ureteral orifices and verumontanum intact at conclusion of case  EBL: Minimal  Specimens: Prostate chips  Enucleation time: 16 minutes  Morcellation time: 20 minutes  Intra-op weight: 70g  Drains: 24 French three-way, 60 cc in balloon  Indication: Alexander Ortega is a 73 y.o. patient with BPH and Foley dependent urinary retention, history of UroLift, who was interested in median lobe only HOLEP to preserve ejaculatory function but resume spontaneous voiding.  After reviewing the management options for treatment, they elected to proceed with the above surgical procedure(s). We have discussed the potential benefits and risks of the procedure, side effects of the proposed treatment, the likelihood of the patient achieving the goals of the procedure, and any potential problems that might occur during the procedure or recuperation.  We specifically discussed the risks of bleeding, infection, hematuria and clot retention, need for additional procedures, possible overnight hospital stay, temporary urgency and incontinence, rare long-term incontinence, and retrograde ejaculation.  Informed consent has been obtained.   Description of procedure:  The patient was taken to the operating room and general anesthesia was induced.  The patient was placed in the dorsal lithotomy position, prepped and draped in the usual sterile fashion, and preoperative antibiotics(Ancef) were administered.  SCDs were placed for DVT prophylaxis.  A preoperative time-out was  performed.   Alexander Ortega sounds were used to gently dilated the urethra up to 58F. The 43 French continuous flow resectoscope was inserted into the urethra using the visual obturator  The prostate was large with massive median lobe hypertrophy, and the median lobe wrapped anteriorly up the left side to approximately 1 o' clock. The bladder was thoroughly inspected and notable for moderate trabeculations, there is also a UroLift clip at the base of the bladder which was evacuated.  The ureteral orifices were located in orthotopic position.    The laser was set to 2 J and 60 Hz and an incision made around 7 o'clock at the bladder neck adjacent to the median lobe and carried down to the verumontanum.  I then identified the fissure of the median lobe which was quite lateral and anterior almost at 1 o'clock, and an identical incision was made from the bladder neck back towards the sphincter.  Early apical release was then performed connecting the 2 incisions, and the large median lobe was enucleated en bloc into the bladder.  The capsule was examined and laser was used for meticulous hemostasis.    The 81 French resectoscope was then switched out for the 26 French nephroscope and prostate tissue was morcellated(Piranha) and the tissue sent to pathology.  A second morcellator needed to be opened as the first was damaged by a UroLift clip.  Numerous UroLift clips were evacuated.  At the very end there was a small " beach ball" adenoma that could not be morcellated, and this was grasped and removed intact with a basket.  A 24 French three-way catheter was inserted easily with the aid of a catheter guide, and 60 cc were placed in the balloon.  Urine was clear.  The catheter irrigated easily with a Toomey syringe.  CBI was initiated.  The patient tolerated the procedure well without any immediate complications and was extubated and transferred to the recovery room in stable condition.  Urine was clear on fast  CBI.  Disposition: Stable to PACU  Plan: Wean CBI in PACU, anticipate discharge home today with clear in clinic in 2-3 days  Legrand Rams, MD 05/10/2023

## 2023-05-10 NOTE — Interval H&P Note (Signed)
UROLOGY H&P UPDATE  Agree with prior H&P dated 05/01/2023.  73 year old male with history of UroLift, 90 g prostate, large median lobe, currently in urinary retention with Foley catheter in place.  He is interested in preserving ejaculatory function addressing the median lobe only today with median lobe enucleation.  We discussed <10% risk of retrograde ejaculation despite median lobe enucleation alone, as well as potential for recurrent symptoms by not addressing lateral lobes of the prostate.  Cardiac: RRR Lungs: CTA bilaterally  Laterality: n/a Procedure: HOLEP of median lobe  Urine: Culture with E. coli, on culture appropriate antibiotics  We discussed the risks and benefits of HoLEP at length.  The procedure requires general anesthesia and takes 1 to 2 hours, and a holmium laser is used to enucleate the prostate and push this tissue into the bladder.  A morcellator is then used to remove this tissue, which is sent for pathology.  The vast majority(>95%) of patients are able to discharge the same day with a catheter in place for 2 to 3 days, and will follow-up in clinic for a voiding trial.  We specifically discussed the risks of bleeding, infection, retrograde ejaculation, temporary urgency and urge incontinence, very low risk of long-term incontinence, urethral stricture/bladder neck contracture, pathologic evaluation of prostate tissue and possible detection of prostate cancer or other malignancy, and possible need for additional procedures.    Sondra Come, MD 05/10/2023

## 2023-05-11 NOTE — Anesthesia Postprocedure Evaluation (Signed)
Anesthesia Post Note  Patient: Alexander Ortega  Procedure(s) Performed: HOLEP-LASER ENUCLEATION OF THE PROSTATE WITH MORCELLATION (Prostate)  Patient location during evaluation: PACU Anesthesia Type: General Level of consciousness: awake and alert Pain management: pain level controlled Vital Signs Assessment: post-procedure vital signs reviewed and stable Respiratory status: spontaneous breathing, nonlabored ventilation, respiratory function stable and patient connected to nasal cannula oxygen Cardiovascular status: blood pressure returned to baseline and stable Postop Assessment: no apparent nausea or vomiting Anesthetic complications: no   No notable events documented.   Last Vitals:  Vitals:   05/10/23 1415 05/10/23 1438  BP:  121/73  Pulse: 64 71  Resp: 16 17  Temp: (!) 36.2 C (!) 36.2 C  SpO2: 99% 97%    Last Pain:  Vitals:   05/10/23 1438  TempSrc: Temporal  PainSc: 3                  Lenard Simmer

## 2023-05-12 ENCOUNTER — Encounter: Payer: Self-pay | Admitting: Urology

## 2023-05-13 ENCOUNTER — Ambulatory Visit: Payer: PPO | Admitting: Physician Assistant

## 2023-05-13 VITALS — BP 96/55 | HR 84 | Ht 71.0 in | Wt 174.5 lb

## 2023-05-13 DIAGNOSIS — N401 Enlarged prostate with lower urinary tract symptoms: Secondary | ICD-10-CM

## 2023-05-13 DIAGNOSIS — N138 Other obstructive and reflux uropathy: Secondary | ICD-10-CM

## 2023-05-13 LAB — SURGICAL PATHOLOGY

## 2023-05-13 NOTE — Progress Notes (Signed)
Catheter Removal  Patient is present today for a catheter removal.  54ml of water was drained from the balloon. A 24FR three-way foley cath was removed from the bladder, no complications were noted. Patient tolerated well.  Performed by: Carman Ching, PA-C   Additional notes: Counseled patient on normal postoperative findings including dysuria, gross hematuria, and urinary urgency/leakage. Counseled patient to begin Kegel exercises 3x10 sets daily to increase urinary control and wear absorbent products as needed for security. Written and verbal resources provided today. Surgical pathology pending; will defer to Dr. Richardo Hanks to share results when available.   Follow up: Return in about 4 months (around 09/10/2023) for Postop f/u with Dr. Richardo Hanks.

## 2023-05-13 NOTE — Patient Instructions (Signed)

## 2023-05-21 ENCOUNTER — Other Ambulatory Visit: Payer: Self-pay | Admitting: Urology

## 2023-05-21 ENCOUNTER — Ambulatory Visit: Payer: PPO | Admitting: Physician Assistant

## 2023-05-21 VITALS — BP 108/68 | HR 90 | Temp 97.8°F | Ht 71.0 in | Wt 166.0 lb

## 2023-05-21 DIAGNOSIS — R82998 Other abnormal findings in urine: Secondary | ICD-10-CM

## 2023-05-21 DIAGNOSIS — R21 Rash and other nonspecific skin eruption: Secondary | ICD-10-CM | POA: Diagnosis not present

## 2023-05-21 DIAGNOSIS — N453 Epididymo-orchitis: Secondary | ICD-10-CM

## 2023-05-21 DIAGNOSIS — B372 Candidiasis of skin and nail: Secondary | ICD-10-CM

## 2023-05-21 DIAGNOSIS — N138 Other obstructive and reflux uropathy: Secondary | ICD-10-CM | POA: Diagnosis not present

## 2023-05-21 DIAGNOSIS — N401 Enlarged prostate with lower urinary tract symptoms: Secondary | ICD-10-CM | POA: Diagnosis not present

## 2023-05-21 LAB — URINALYSIS, COMPLETE
Bilirubin, UA: NEGATIVE
Glucose, UA: NEGATIVE
Nitrite, UA: POSITIVE — AB
Specific Gravity, UA: >1.03 — ABNORMAL HIGH (ref 1.005–1.030)
Urobilinogen, Ur: 0.2 mg/dL (ref 0.2–1.0)
pH, UA: 5.5 (ref 5.0–7.5)

## 2023-05-21 LAB — MICROSCOPIC EXAMINATION
RBC, Urine: >30 /[HPF] — AB (ref 0–2)
WBC, UA: >30 /[HPF] — AB (ref 0–5)

## 2023-05-21 LAB — BLADDER SCAN AMB NON-IMAGING: Scan Result: 7

## 2023-05-21 MED ORDER — KETOCONAZOLE 2 % EX CREA: TOPICAL_CREAM | CUTANEOUS | 1 refills | Status: DC

## 2023-05-21 MED ORDER — CEFTRIAXONE SODIUM 1 G IJ SOLR
500.0000 mg | Freq: Once | INTRAMUSCULAR | Status: AC
  Administered 2023-05-21: 500 mg via INTRAMUSCULAR

## 2023-05-21 MED ORDER — LEVOFLOXACIN 500 MG PO TABS: 500.0000 mg | ORAL_TABLET | Freq: Every day | ORAL | 0 refills | Status: DC

## 2023-05-21 NOTE — Patient Instructions (Addendum)
You received an injection of Rocephin (ceftriaxone) in clinic today. Please start once daily Levaquin (levofloxacin) tomorrow (Wednesday) and take it for 10 days.  I'm also prescribing topical ketoconazole cream for the rash on your leg. Please use it twice daily for 2-4 weeks.

## 2023-05-21 NOTE — Progress Notes (Signed)
IM Injection  Patient is present today for an IM Injection for treatment of rocephin Drug: rocephin Dose:500 mg Location:RUOQ Lot: [id number] Exp:06/21/2024 Patient tolerated well, no complications were noted  Performed by: Randa Lynn, RMA

## 2023-05-21 NOTE — Telephone Encounter (Signed)
LVM for pt to return call

## 2023-05-21 NOTE — Telephone Encounter (Signed)
Appt scheduled, pt aware

## 2023-05-21 NOTE — Progress Notes (Signed)
05/21/2023 3:11 PM   Alexander Ortega 1950-12-04 [id number]  CC: Chief Complaint  Patient presents with   Follow-up   HPI: Alexander Ortega is a 73 y.o. male with PMH BPH with urinary retention s/p HOLEP with Dr. Richardo Hanks 11 days ago and elevated PSA who presents today for evaluation of possible UTI.   Today he reports a patch of red rash on his left upper inner thigh that started about a week ago.  Within the past 24 hours, he has noticed some swelling and discomfort in his left testicle.  He also had some increased urinary urgency over the weekend.  In-office UA today positive for 2+ ketones, 3+ blood, 3+ protein, nitrates, and 1+ leukocytes; urine microscopy with >30 WBCs/HPF, >30 RBCs/HPF, and many bacteria. PVR 7mL.  PMH: Past Medical History:  Diagnosis Date   Anemia    Pneumonia    Skin cancer    melanoma 2006, 2017    Surgical History: Past Surgical History:  Procedure Laterality Date   CYSTOSCOPY WITH INSERTION OF UROLIFT  Spring 2019   HOLEP-LASER ENUCLEATION OF THE PROSTATE WITH MORCELLATION N/A 05/10/2023   Procedure: HOLEP-LASER ENUCLEATION OF THE PROSTATE WITH MORCELLATION;  Surgeon: Sondra Come, MD;  Location: [facility] ORS;  Service: Urology;  Laterality: N/A;   prosate mri  2010   prostate biospy  2010    Home Medications:  Allergies as of 05/21/2023       Reactions   Silodosin Other (See Comments)   Nasal congestion    Tamsulosin Other (See Comments)   Too many side effects        Medication List        Accurate as of May 21, 2023  3:11 PM. If you have any questions, ask your nurse or doctor.          amitriptyline 25 MG tablet Commonly known as: ELAVIL Take 12.5 mg by mouth at bedtime.   cyanocobalamin 1000 MCG tablet Commonly known as: VITAMIN B12 Take 1,000 mcg by mouth daily.   docusate sodium 100 MG capsule Commonly known as: COLACE Take 100 mg by mouth 2 (two) times daily.   ferrous sulfate 325 (65 FE) MG EC tablet Take  325 mg by mouth every Tuesday, Thursday, Saturday, and Sunday.   Fish Oil 1000 MG Cpdr Take 1,290 mg by mouth daily.   ibuprofen 200 MG tablet Commonly known as: ADVIL Take 400 mg by mouth 2 (two) times daily as needed for moderate pain (pain score 4-6) or mild pain (pain score 1-3).   LUTEIN 20 PO Take 20 mg by mouth 2 (two) times daily.   MAGNESIUM PO Take 150 mg by mouth daily.   melatonin 1 MG Tabs tablet Take 1 mg by mouth at bedtime.   oxybutynin 10 MG 24 hr tablet Commonly known as: DITROPAN-XL Take 1 tablet (10 mg total) by mouth daily as needed (For bladder spasms, bladder pain).   QC Tumeric Complex 500 MG Caps Generic drug: Turmeric Take 500 mg by mouth daily.   rosuvastatin 5 MG tablet Commonly known as: CRESTOR Take 5 mg by mouth daily.        Allergies:  Allergies  Allergen Reactions   Silodosin Other (See Comments)    Nasal congestion    Tamsulosin Other (See Comments)    Too many side effects    Family History: No family history on file.  Social History:   reports that he has never smoked. He has never used smokeless tobacco. He  reports that he does not currently use alcohol after a past usage of about 4.0 standard drinks of alcohol per week. He reports that he does not use drugs.  Physical Exam: BP 108/68   Pulse 90   Temp 97.8 F (36.6 C) (Oral)   Ht 5\' 11"  (1.803 m)   Wt 166 lb (75.3 kg)   BMI 23.15 kg/m   Constitutional:  Alert and oriented, no acute distress, nontoxic appearing HEENT: [place], AT Cardiovascular: No clubbing, cyanosis, or edema Respiratory: Normal respiratory effort, no increased work of breathing GU: Bilateral descended testicles.  No scrotal edema.  Left testicle and epididymis are enlarged and tender.  No purulence, fluctuance, or malodor.  Additionally, there is a well-demarcated patch of erythema on the upper inner left thigh. Skin: No rashes, bruises or suspicious lesions Neurologic: Grossly intact, no focal  deficits, moving all 4 extremities Psychiatric: Normal mood and affect  Laboratory Data: Results for orders placed or performed in visit on 05/21/23  Microscopic Examination   Collection Time: 05/21/23  2:55 PM   Urine  Result Value Ref Range   WBC, UA >30 (A) 0 - 5 /hpf   RBC, Urine >30 (A) 0 - 2 /hpf   Epithelial Cells (non renal) 0-10 0 - 10 /hpf   Mucus, UA Present (A) Not Estab.   Bacteria, UA Many (A) None seen/Few  Urinalysis, Complete   Collection Time: 05/21/23  2:55 PM  Result Value Ref Range   Specific Gravity, UA >1.030 (H) 1.005 - 1.030   pH, UA 5.5 5.0 - 7.5   Color, UA Yellow Yellow   Appearance Ur Cloudy (A) Clear   Leukocytes,UA 1+ (A) Negative   Protein,UA 3+ (A) Negative/Trace   Glucose, UA Negative Negative   Ketones, UA 2+ (A) Negative   RBC, UA 3+ (A) Negative   Bilirubin, UA Negative Negative   Urobilinogen, Ur 0.2 0.2 - 1.0 mg/dL   Nitrite, UA Positive (A) Negative   Microscopic Examination See below:   BLADDER SCAN AMB NON-IMAGING   Collection Time: 05/21/23  3:00 PM  Result Value Ref Range   Scan Result 7 ml    Assessment & Plan:   1. Epididymoorchitis (Primary) UA appears grossly infected and physical exam is consistent with left epididymoorchitis.  Given recent urologic procedure, will treat with 500 mg IM Rocephin followed by 10 days of p.o. Levaquin.  Will send urine for culture.  We discussed return precautions including fever greater than 101 F or sudden worsening in swelling, redness, or pain. - Urinalysis, Complete - BLADDER SCAN AMB NON-IMAGING - CULTURE, URINE COMPREHENSIVE - levofloxacin (LEVAQUIN) 500 MG tablet; Take 1 tablet (500 mg total) by mouth daily.  Dispense: 10 tablet; Refill: 0 - cefTRIAXone (ROCEPHIN) injection 500 mg  2. Candidal intertrigo Upper thigh rash appears to be candidal, though cannot rule out chafing from Depends.  Will prescribe topical ketoconazole. - ketoconazole (NIZORAL) 2 % cream; Apply topically to  rash 1-2 times daily for 2-4 weeks.  Dispense: 30 g; Refill: 1   Return if symptoms worsen or fail to improve.  Carman Ching, PA-C  [facility] [place] [address], [address] [place], [place] [address] [phone]

## 2023-05-24 DIAGNOSIS — H35371 Puckering of macula, right eye: Secondary | ICD-10-CM | POA: Diagnosis not present

## 2023-05-24 DIAGNOSIS — H25813 Combined forms of age-related cataract, bilateral: Secondary | ICD-10-CM | POA: Diagnosis not present

## 2023-05-24 DIAGNOSIS — H18513 Endothelial corneal dystrophy, bilateral: Secondary | ICD-10-CM | POA: Diagnosis not present

## 2023-05-24 DIAGNOSIS — H31091 Other chorioretinal scars, right eye: Secondary | ICD-10-CM | POA: Diagnosis not present

## 2023-05-24 LAB — CULTURE, URINE COMPREHENSIVE

## 2023-06-14 ENCOUNTER — Other Ambulatory Visit: Payer: Self-pay

## 2023-06-14 ENCOUNTER — Telehealth: Payer: Self-pay | Admitting: Physician Assistant

## 2023-06-14 MED ORDER — SULFAMETHOXAZOLE-TRIMETHOPRIM 800-160 MG PO TABS: 1.0000 | ORAL_TABLET | Freq: Two times a day (BID) | ORAL | 0 refills | Status: AC

## 2023-06-14 NOTE — Telephone Encounter (Signed)
 PT called requesting to speak with [facility]. Pt did not specify or give a reason but insisted to speak with [facility] .

## 2023-06-14 NOTE — Telephone Encounter (Signed)
 Per Sam, antibiotics were sent, pt aware. Pt states he is wearing tight supportive underwear.

## 2023-06-19 DIAGNOSIS — E7800 Pure hypercholesterolemia, unspecified: Secondary | ICD-10-CM | POA: Diagnosis not present

## 2023-06-19 DIAGNOSIS — Z0000 Encounter for general adult medical examination without abnormal findings: Secondary | ICD-10-CM | POA: Diagnosis not present

## 2023-06-19 DIAGNOSIS — E538 Deficiency of other specified B group vitamins: Secondary | ICD-10-CM | POA: Diagnosis not present

## 2023-06-26 DIAGNOSIS — N289 Disorder of kidney and ureter, unspecified: Secondary | ICD-10-CM | POA: Diagnosis not present

## 2023-06-26 DIAGNOSIS — E7800 Pure hypercholesterolemia, unspecified: Secondary | ICD-10-CM | POA: Diagnosis not present

## 2023-06-26 DIAGNOSIS — R972 Elevated prostate specific antigen [PSA]: Secondary | ICD-10-CM | POA: Diagnosis not present

## 2023-06-26 DIAGNOSIS — N401 Enlarged prostate with lower urinary tract symptoms: Secondary | ICD-10-CM | POA: Diagnosis not present

## 2023-06-26 DIAGNOSIS — Z85820 Personal history of malignant melanoma of skin: Secondary | ICD-10-CM | POA: Diagnosis not present

## 2023-06-26 DIAGNOSIS — Z0000 Encounter for general adult medical examination without abnormal findings: Secondary | ICD-10-CM | POA: Diagnosis not present

## 2023-07-02 DIAGNOSIS — H25811 Combined forms of age-related cataract, right eye: Secondary | ICD-10-CM | POA: Diagnosis not present

## 2023-07-30 DIAGNOSIS — H268 Other specified cataract: Secondary | ICD-10-CM | POA: Diagnosis not present

## 2023-07-30 DIAGNOSIS — H25811 Combined forms of age-related cataract, right eye: Secondary | ICD-10-CM | POA: Diagnosis not present

## 2023-08-06 DIAGNOSIS — H25812 Combined forms of age-related cataract, left eye: Secondary | ICD-10-CM | POA: Diagnosis not present

## 2023-08-13 DIAGNOSIS — H268 Other specified cataract: Secondary | ICD-10-CM | POA: Diagnosis not present

## 2023-08-13 DIAGNOSIS — H25812 Combined forms of age-related cataract, left eye: Secondary | ICD-10-CM | POA: Diagnosis not present

## 2023-09-05 ENCOUNTER — Ambulatory Visit: Payer: Self-pay | Admitting: Urology

## 2023-09-05 VITALS — BP 122/77 | HR 72 | Ht 70.0 in | Wt 170.0 lb

## 2023-09-05 DIAGNOSIS — N401 Enlarged prostate with lower urinary tract symptoms: Secondary | ICD-10-CM | POA: Diagnosis not present

## 2023-09-05 DIAGNOSIS — N138 Other obstructive and reflux uropathy: Secondary | ICD-10-CM | POA: Diagnosis not present

## 2023-09-05 LAB — BLADDER SCAN AMB NON-IMAGING: Scan Result: 48

## 2023-09-05 NOTE — Progress Notes (Signed)
   09/05/2023 4:16 PM   Alexander Ortega [birth year] [id number]  Reason for visit: Follow up BPH status post HOLEP  HPI: 72 year old male who was referred from Dr. Derrick Fling for HOLEP of median lobe, he has a history of UroLift, 90 g prostate, and was in urinary retention with Foley catheter.  He was interested in preserving ejaculatory function and addressing the median lobe only.  He underwent uncomplicated HOLEP on 05/10/2023 with removal of large median lobe, pathology showed 42 g of benign prostate tissue.  He did have an episode of epididymitis postop treated with antibiotics.  He has been doing extremely well since that time.  He is not having any incontinence.  No problems with retrograde ejaculation.  Nocturia has improved significantly, and good strength of stream.  PVR today normal at 48ml.  He continues on the alfuzosin, I recommended he stop that medication.  He is also having some intermittent mild right-sided testicular pain, exam today benign.  Can trial NSAIDs.  Can return to follow-up with Dr. Derrick Fling [facility] for yearly PVR, PSA.  He expressed interest in following up here as well which would also be fine for yearly monitoring if he prefers  Alexander Pressman, MD  [facility] [address] [place], [place] [address] [phone]

## 2023-09-05 NOTE — Patient Instructions (Signed)

## 2023-09-20 DIAGNOSIS — Z85828 Personal history of other malignant neoplasm of skin: Secondary | ICD-10-CM | POA: Diagnosis not present

## 2023-09-20 DIAGNOSIS — D1801 Hemangioma of skin and subcutaneous tissue: Secondary | ICD-10-CM | POA: Diagnosis not present

## 2023-09-20 DIAGNOSIS — L821 Other seborrheic keratosis: Secondary | ICD-10-CM | POA: Diagnosis not present

## 2023-09-20 DIAGNOSIS — L720 Epidermal cyst: Secondary | ICD-10-CM | POA: Diagnosis not present

## 2023-09-20 DIAGNOSIS — L570 Actinic keratosis: Secondary | ICD-10-CM | POA: Diagnosis not present

## 2023-09-20 DIAGNOSIS — D225 Melanocytic nevi of trunk: Secondary | ICD-10-CM | POA: Diagnosis not present

## 2023-09-20 DIAGNOSIS — D2271 Melanocytic nevi of right lower limb, including hip: Secondary | ICD-10-CM | POA: Diagnosis not present

## 2023-09-20 DIAGNOSIS — L820 Inflamed seborrheic keratosis: Secondary | ICD-10-CM | POA: Diagnosis not present

## 2023-09-20 DIAGNOSIS — Z85820 Personal history of malignant melanoma of skin: Secondary | ICD-10-CM | POA: Diagnosis not present

## 2023-11-21 DIAGNOSIS — E7800 Pure hypercholesterolemia, unspecified: Secondary | ICD-10-CM | POA: Diagnosis not present

## 2023-11-21 DIAGNOSIS — N401 Enlarged prostate with lower urinary tract symptoms: Secondary | ICD-10-CM | POA: Diagnosis not present

## 2023-11-28 DIAGNOSIS — H31091 Other chorioretinal scars, right eye: Secondary | ICD-10-CM | POA: Diagnosis not present

## 2023-11-28 DIAGNOSIS — Z961 Presence of intraocular lens: Secondary | ICD-10-CM | POA: Diagnosis not present

## 2023-11-28 DIAGNOSIS — H35371 Puckering of macula, right eye: Secondary | ICD-10-CM | POA: Diagnosis not present

## 2023-11-28 DIAGNOSIS — H43393 Other vitreous opacities, bilateral: Secondary | ICD-10-CM | POA: Diagnosis not present

## 2023-11-28 DIAGNOSIS — H43813 Vitreous degeneration, bilateral: Secondary | ICD-10-CM | POA: Diagnosis not present

## 2023-12-02 DIAGNOSIS — R972 Elevated prostate specific antigen [PSA]: Secondary | ICD-10-CM | POA: Diagnosis not present

## 2023-12-19 ENCOUNTER — Other Ambulatory Visit
Admission: RE | Admit: 2023-12-19 | Discharge: 2023-12-19 | Disposition: A | Discharge status: Home / Self Care | Attending: Urology | Admitting: Urology

## 2023-12-19 ENCOUNTER — Ambulatory Visit: Payer: Self-pay | Admitting: Urology

## 2023-12-19 ENCOUNTER — Ambulatory Visit (INDEPENDENT_AMBULATORY_CARE_PROVIDER_SITE_OTHER): Admitting: Urology

## 2023-12-19 ENCOUNTER — Other Ambulatory Visit: Payer: Self-pay

## 2023-12-19 VITALS — BP 119/73 | HR 72 | Wt 172.0 lb

## 2023-12-19 DIAGNOSIS — N401 Enlarged prostate with lower urinary tract symptoms: Secondary | ICD-10-CM | POA: Insufficient documentation

## 2023-12-19 DIAGNOSIS — N138 Other obstructive and reflux uropathy: Secondary | ICD-10-CM | POA: Diagnosis not present

## 2023-12-19 DIAGNOSIS — N5082 Scrotal pain: Secondary | ICD-10-CM | POA: Diagnosis not present

## 2023-12-19 LAB — URINALYSIS, COMPLETE (UACMP) WITH MICROSCOPIC
Bilirubin Urine: NEGATIVE
Glucose, UA: NEGATIVE mg/dL
Hgb urine dipstick: NEGATIVE
Ketones, ur: NEGATIVE mg/dL
Leukocytes,Ua: NEGATIVE
Nitrite: NEGATIVE
Protein, ur: NEGATIVE mg/dL
Specific Gravity, Urine: 1.015 (ref 1.005–1.030)
pH: 5.5 (ref 5.0–8.0)

## 2023-12-19 LAB — BLADDER SCAN AMB NON-IMAGING

## 2023-12-19 MED ORDER — LEVOFLOXACIN 500 MG PO TABS: 500.0000 mg | ORAL_TABLET | Freq: Every day | ORAL | 0 refills | Status: AC

## 2023-12-19 MED ORDER — CELECOXIB 200 MG PO CAPS: 200.0000 mg | ORAL_CAPSULE | Freq: Two times a day (BID) | ORAL | 0 refills | Status: AC

## 2023-12-19 NOTE — Progress Notes (Signed)
   12/19/2023 3:02 PM   Alexander Ortega July 23, 1950 [id number]  Reason for visit: Follow up right scrotal pain, ED, history of BPH status post HOLEP  History: Referred by Dr. Nieves for HOLEP of median lobe, had a history of UroLift, 90 g prostate Uncomplicated HOLEP 05/10/2023 with removal of large median lobe, pathology 42 g benign tissue Episode of left-sided epididymitis postop treated with antibiotics ED on Cialis Has previously been on amitriptyline  for unclear indications  Physical Exam: BP 119/73 (BP Location: Left Arm, Patient Position: Sitting, Cuff Size: Normal)   Pulse 72   Wt 172 lb (78 kg)   SpO2 97%   BMI 24.68 kg/m  Testicles 20 cc and descended bilaterally, right epididymal tenderness  Imaging/labs: Urinalysis today benign  Today: Continues to have intermittent right scrotal pain 1-2 times per week, no aggravating or alleviating factors Urinating well, continues to take alfuzosin, PVR today normal at Denies any incontinence  Plan:   Trial of Levaquin  and Celebrex  for right scrotal pain and possible epididymitis Consider going back on amitriptyline  25 mg nightly if no improvement with above Can take Cialis 20 mg as needed, if worsening urinary symptoms could go to Cialis 5 mg daily with a boost dose as needed   Alexander JAYSON Burnet, MD  [facility] [address] [place], [place] [address] [phone]

## 2023-12-19 NOTE — Patient Instructions (Signed)
 Stop alfuzosin and amitriptyline .  You can take Cialis as needed.  Take 10 days of Levaquin  and Celebrex , if you are still scrotal pain after completing these medications please let us  know

## 2023-12-22 DIAGNOSIS — E7800 Pure hypercholesterolemia, unspecified: Secondary | ICD-10-CM | POA: Diagnosis not present

## 2023-12-22 DIAGNOSIS — N401 Enlarged prostate with lower urinary tract symptoms: Secondary | ICD-10-CM | POA: Diagnosis not present

## 2023-12-30 DIAGNOSIS — D649 Anemia, unspecified: Secondary | ICD-10-CM | POA: Diagnosis not present

## 2023-12-30 DIAGNOSIS — K921 Melena: Secondary | ICD-10-CM | POA: Diagnosis not present

## 2023-12-30 DIAGNOSIS — K5900 Constipation, unspecified: Secondary | ICD-10-CM | POA: Diagnosis not present

## 2023-12-30 DIAGNOSIS — K649 Unspecified hemorrhoids: Secondary | ICD-10-CM | POA: Diagnosis not present

## 2024-01-21 DIAGNOSIS — N401 Enlarged prostate with lower urinary tract symptoms: Secondary | ICD-10-CM | POA: Diagnosis not present

## 2024-01-21 DIAGNOSIS — E7800 Pure hypercholesterolemia, unspecified: Secondary | ICD-10-CM | POA: Diagnosis not present

## 2024-02-06 DIAGNOSIS — J069 Acute upper respiratory infection, unspecified: Secondary | ICD-10-CM | POA: Diagnosis not present

## 2024-03-25 DIAGNOSIS — D225 Melanocytic nevi of trunk: Secondary | ICD-10-CM | POA: Diagnosis not present

## 2024-03-25 DIAGNOSIS — L918 Other hypertrophic disorders of the skin: Secondary | ICD-10-CM | POA: Diagnosis not present

## 2024-03-25 DIAGNOSIS — L821 Other seborrheic keratosis: Secondary | ICD-10-CM | POA: Diagnosis not present

## 2024-03-25 DIAGNOSIS — L570 Actinic keratosis: Secondary | ICD-10-CM | POA: Diagnosis not present

## 2024-03-25 DIAGNOSIS — L820 Inflamed seborrheic keratosis: Secondary | ICD-10-CM | POA: Diagnosis not present

## 2024-03-25 DIAGNOSIS — Z85820 Personal history of malignant melanoma of skin: Secondary | ICD-10-CM | POA: Diagnosis not present

## 2024-03-25 DIAGNOSIS — B078 Other viral warts: Secondary | ICD-10-CM | POA: Diagnosis not present

## 2024-03-25 DIAGNOSIS — D1801 Hemangioma of skin and subcutaneous tissue: Secondary | ICD-10-CM | POA: Diagnosis not present

## 2024-03-25 DIAGNOSIS — Z85828 Personal history of other malignant neoplasm of skin: Secondary | ICD-10-CM | POA: Diagnosis not present
